# Patient Record
Sex: Male | Born: 1955 | Race: White | Hispanic: No | Marital: Married | Smoking: Current every day smoker
Health system: Southern US, Community
[De-identification: ages and names within clinical notes are randomized; demographics above are authoritative.]

## PROBLEM LIST (undated history)

## (undated) DIAGNOSIS — M1990 Unspecified osteoarthritis, unspecified site: Secondary | ICD-10-CM

## (undated) DIAGNOSIS — I10 Essential (primary) hypertension: Secondary | ICD-10-CM

## (undated) DIAGNOSIS — J449 Chronic obstructive pulmonary disease, unspecified: Secondary | ICD-10-CM

## (undated) DIAGNOSIS — E785 Hyperlipidemia, unspecified: Secondary | ICD-10-CM

## (undated) HISTORY — DX: Chronic obstructive pulmonary disease, unspecified: J44.9

## (undated) HISTORY — DX: Unspecified osteoarthritis, unspecified site: M19.90

## (undated) HISTORY — DX: Hyperlipidemia, unspecified: E78.5

## (undated) HISTORY — DX: Essential (primary) hypertension: I10

---
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
## 1983-11-21 HISTORY — PX: OTHER SURGICAL HISTORY: SHX169

## 1988-11-20 HISTORY — PX: CHOLECYSTECTOMY: SHX55

## 2009-11-20 HISTORY — PX: HEMORROIDECTOMY: SUR656

## 2011-11-21 HISTORY — PX: REPLACEMENT TOTAL KNEE: SUR1224

## 2016-03-28 DIAGNOSIS — Z6831 Body mass index (BMI) 31.0-31.9, adult: Secondary | ICD-10-CM | POA: Diagnosis not present

## 2016-03-28 DIAGNOSIS — I10 Essential (primary) hypertension: Secondary | ICD-10-CM | POA: Diagnosis not present

## 2016-03-28 DIAGNOSIS — Z720 Tobacco use: Secondary | ICD-10-CM | POA: Diagnosis not present

## 2016-03-28 DIAGNOSIS — F419 Anxiety disorder, unspecified: Secondary | ICD-10-CM | POA: Diagnosis not present

## 2016-04-28 DIAGNOSIS — I10 Essential (primary) hypertension: Secondary | ICD-10-CM | POA: Diagnosis not present

## 2016-04-28 DIAGNOSIS — E669 Obesity, unspecified: Secondary | ICD-10-CM | POA: Diagnosis not present

## 2016-04-28 DIAGNOSIS — Z6832 Body mass index (BMI) 32.0-32.9, adult: Secondary | ICD-10-CM | POA: Diagnosis not present

## 2016-04-28 DIAGNOSIS — M179 Osteoarthritis of knee, unspecified: Secondary | ICD-10-CM | POA: Diagnosis not present

## 2016-04-28 DIAGNOSIS — J449 Chronic obstructive pulmonary disease, unspecified: Secondary | ICD-10-CM | POA: Diagnosis not present

## 2016-04-28 DIAGNOSIS — F419 Anxiety disorder, unspecified: Secondary | ICD-10-CM | POA: Diagnosis not present

## 2016-07-27 DIAGNOSIS — J329 Chronic sinusitis, unspecified: Secondary | ICD-10-CM | POA: Diagnosis not present

## 2016-07-27 DIAGNOSIS — Z6832 Body mass index (BMI) 32.0-32.9, adult: Secondary | ICD-10-CM | POA: Diagnosis not present

## 2016-07-27 DIAGNOSIS — E669 Obesity, unspecified: Secondary | ICD-10-CM | POA: Diagnosis not present

## 2016-07-27 DIAGNOSIS — Z1389 Encounter for screening for other disorder: Secondary | ICD-10-CM | POA: Diagnosis not present

## 2016-07-27 DIAGNOSIS — F418 Other specified anxiety disorders: Secondary | ICD-10-CM | POA: Diagnosis not present

## 2016-08-29 DIAGNOSIS — E669 Obesity, unspecified: Secondary | ICD-10-CM | POA: Diagnosis not present

## 2016-08-29 DIAGNOSIS — I10 Essential (primary) hypertension: Secondary | ICD-10-CM | POA: Diagnosis not present

## 2016-08-29 DIAGNOSIS — F418 Other specified anxiety disorders: Secondary | ICD-10-CM | POA: Diagnosis not present

## 2016-08-29 DIAGNOSIS — R61 Generalized hyperhidrosis: Secondary | ICD-10-CM | POA: Diagnosis not present

## 2016-08-29 DIAGNOSIS — Z6832 Body mass index (BMI) 32.0-32.9, adult: Secondary | ICD-10-CM | POA: Diagnosis not present

## 2016-09-29 DIAGNOSIS — Z6832 Body mass index (BMI) 32.0-32.9, adult: Secondary | ICD-10-CM | POA: Diagnosis not present

## 2016-09-29 DIAGNOSIS — E669 Obesity, unspecified: Secondary | ICD-10-CM | POA: Diagnosis not present

## 2016-09-29 DIAGNOSIS — I10 Essential (primary) hypertension: Secondary | ICD-10-CM | POA: Diagnosis not present

## 2016-09-29 DIAGNOSIS — M549 Dorsalgia, unspecified: Secondary | ICD-10-CM | POA: Diagnosis not present

## 2016-09-29 DIAGNOSIS — F418 Other specified anxiety disorders: Secondary | ICD-10-CM | POA: Diagnosis not present

## 2016-10-06 DIAGNOSIS — M549 Dorsalgia, unspecified: Secondary | ICD-10-CM | POA: Diagnosis not present

## 2016-10-06 DIAGNOSIS — M5416 Radiculopathy, lumbar region: Secondary | ICD-10-CM | POA: Diagnosis not present

## 2016-10-06 DIAGNOSIS — M4316 Spondylolisthesis, lumbar region: Secondary | ICD-10-CM | POA: Diagnosis not present

## 2016-12-28 DIAGNOSIS — Z6833 Body mass index (BMI) 33.0-33.9, adult: Secondary | ICD-10-CM | POA: Diagnosis not present

## 2016-12-28 DIAGNOSIS — E669 Obesity, unspecified: Secondary | ICD-10-CM | POA: Diagnosis not present

## 2016-12-28 DIAGNOSIS — I10 Essential (primary) hypertension: Secondary | ICD-10-CM | POA: Diagnosis not present

## 2017-04-27 DIAGNOSIS — F419 Anxiety disorder, unspecified: Secondary | ICD-10-CM | POA: Diagnosis not present

## 2017-04-27 DIAGNOSIS — M179 Osteoarthritis of knee, unspecified: Secondary | ICD-10-CM | POA: Diagnosis not present

## 2017-04-27 DIAGNOSIS — I10 Essential (primary) hypertension: Secondary | ICD-10-CM | POA: Diagnosis not present

## 2017-04-27 DIAGNOSIS — Z7689 Persons encountering health services in other specified circumstances: Secondary | ICD-10-CM | POA: Diagnosis not present

## 2017-04-27 DIAGNOSIS — Z6831 Body mass index (BMI) 31.0-31.9, adult: Secondary | ICD-10-CM | POA: Diagnosis not present

## 2017-08-07 DIAGNOSIS — Z1389 Encounter for screening for other disorder: Secondary | ICD-10-CM | POA: Diagnosis not present

## 2017-08-07 DIAGNOSIS — Z0000 Encounter for general adult medical examination without abnormal findings: Secondary | ICD-10-CM | POA: Diagnosis not present

## 2017-08-07 DIAGNOSIS — Z9181 History of falling: Secondary | ICD-10-CM | POA: Diagnosis not present

## 2017-08-07 DIAGNOSIS — Z1211 Encounter for screening for malignant neoplasm of colon: Secondary | ICD-10-CM | POA: Diagnosis not present

## 2017-08-07 DIAGNOSIS — E669 Obesity, unspecified: Secondary | ICD-10-CM | POA: Diagnosis not present

## 2017-08-07 DIAGNOSIS — Z6832 Body mass index (BMI) 32.0-32.9, adult: Secondary | ICD-10-CM | POA: Diagnosis not present

## 2017-08-07 DIAGNOSIS — Z125 Encounter for screening for malignant neoplasm of prostate: Secondary | ICD-10-CM | POA: Diagnosis not present

## 2017-08-07 DIAGNOSIS — E785 Hyperlipidemia, unspecified: Secondary | ICD-10-CM | POA: Diagnosis not present

## 2017-08-07 DIAGNOSIS — Z136 Encounter for screening for cardiovascular disorders: Secondary | ICD-10-CM | POA: Diagnosis not present

## 2017-08-17 DIAGNOSIS — Z125 Encounter for screening for malignant neoplasm of prostate: Secondary | ICD-10-CM | POA: Diagnosis not present

## 2017-08-17 DIAGNOSIS — E669 Obesity, unspecified: Secondary | ICD-10-CM | POA: Diagnosis not present

## 2017-08-17 DIAGNOSIS — R7301 Impaired fasting glucose: Secondary | ICD-10-CM | POA: Diagnosis not present

## 2017-08-17 DIAGNOSIS — E785 Hyperlipidemia, unspecified: Secondary | ICD-10-CM | POA: Diagnosis not present

## 2017-08-17 DIAGNOSIS — Z1159 Encounter for screening for other viral diseases: Secondary | ICD-10-CM | POA: Diagnosis not present

## 2017-08-17 DIAGNOSIS — R748 Abnormal levels of other serum enzymes: Secondary | ICD-10-CM | POA: Diagnosis not present

## 2017-08-17 DIAGNOSIS — Z6832 Body mass index (BMI) 32.0-32.9, adult: Secondary | ICD-10-CM | POA: Diagnosis not present

## 2017-08-17 DIAGNOSIS — I10 Essential (primary) hypertension: Secondary | ICD-10-CM | POA: Diagnosis not present

## 2017-08-19 DIAGNOSIS — Z1212 Encounter for screening for malignant neoplasm of rectum: Secondary | ICD-10-CM | POA: Diagnosis not present

## 2017-08-19 DIAGNOSIS — Z1211 Encounter for screening for malignant neoplasm of colon: Secondary | ICD-10-CM | POA: Diagnosis not present

## 2017-09-18 DIAGNOSIS — I10 Essential (primary) hypertension: Secondary | ICD-10-CM | POA: Diagnosis not present

## 2017-09-18 DIAGNOSIS — Z6832 Body mass index (BMI) 32.0-32.9, adult: Secondary | ICD-10-CM | POA: Diagnosis not present

## 2017-10-19 DIAGNOSIS — I10 Essential (primary) hypertension: Secondary | ICD-10-CM | POA: Diagnosis not present

## 2017-10-19 DIAGNOSIS — E785 Hyperlipidemia, unspecified: Secondary | ICD-10-CM | POA: Diagnosis not present

## 2017-10-19 DIAGNOSIS — Z6832 Body mass index (BMI) 32.0-32.9, adult: Secondary | ICD-10-CM | POA: Diagnosis not present

## 2018-01-17 DIAGNOSIS — F411 Generalized anxiety disorder: Secondary | ICD-10-CM | POA: Diagnosis not present

## 2018-01-17 DIAGNOSIS — E785 Hyperlipidemia, unspecified: Secondary | ICD-10-CM | POA: Diagnosis not present

## 2018-01-17 DIAGNOSIS — Z6833 Body mass index (BMI) 33.0-33.9, adult: Secondary | ICD-10-CM | POA: Diagnosis not present

## 2018-01-17 DIAGNOSIS — I10 Essential (primary) hypertension: Secondary | ICD-10-CM | POA: Diagnosis not present

## 2018-02-15 DIAGNOSIS — Z6833 Body mass index (BMI) 33.0-33.9, adult: Secondary | ICD-10-CM | POA: Diagnosis not present

## 2018-02-15 DIAGNOSIS — F411 Generalized anxiety disorder: Secondary | ICD-10-CM | POA: Diagnosis not present

## 2018-02-15 DIAGNOSIS — I10 Essential (primary) hypertension: Secondary | ICD-10-CM | POA: Diagnosis not present

## 2018-04-17 DIAGNOSIS — F411 Generalized anxiety disorder: Secondary | ICD-10-CM | POA: Diagnosis not present

## 2018-04-17 DIAGNOSIS — J449 Chronic obstructive pulmonary disease, unspecified: Secondary | ICD-10-CM | POA: Diagnosis not present

## 2018-04-17 DIAGNOSIS — Z6833 Body mass index (BMI) 33.0-33.9, adult: Secondary | ICD-10-CM | POA: Diagnosis not present

## 2018-07-18 DIAGNOSIS — R000 Tachycardia, unspecified: Secondary | ICD-10-CM | POA: Diagnosis not present

## 2018-07-18 DIAGNOSIS — R7989 Other specified abnormal findings of blood chemistry: Secondary | ICD-10-CM | POA: Diagnosis not present

## 2018-07-18 DIAGNOSIS — R748 Abnormal levels of other serum enzymes: Secondary | ICD-10-CM | POA: Diagnosis not present

## 2018-07-18 DIAGNOSIS — I4892 Unspecified atrial flutter: Secondary | ICD-10-CM | POA: Diagnosis not present

## 2018-07-18 DIAGNOSIS — I10 Essential (primary) hypertension: Secondary | ICD-10-CM | POA: Diagnosis not present

## 2018-07-18 DIAGNOSIS — F419 Anxiety disorder, unspecified: Secondary | ICD-10-CM | POA: Diagnosis not present

## 2018-07-18 DIAGNOSIS — Z87891 Personal history of nicotine dependence: Secondary | ICD-10-CM | POA: Diagnosis not present

## 2018-07-18 DIAGNOSIS — Z6833 Body mass index (BMI) 33.0-33.9, adult: Secondary | ICD-10-CM | POA: Diagnosis not present

## 2018-07-18 DIAGNOSIS — J449 Chronic obstructive pulmonary disease, unspecified: Secondary | ICD-10-CM | POA: Diagnosis not present

## 2018-07-18 DIAGNOSIS — I483 Typical atrial flutter: Secondary | ICD-10-CM

## 2018-07-18 DIAGNOSIS — Z79899 Other long term (current) drug therapy: Secondary | ICD-10-CM | POA: Diagnosis not present

## 2018-07-18 DIAGNOSIS — J9811 Atelectasis: Secondary | ICD-10-CM | POA: Diagnosis not present

## 2018-07-18 DIAGNOSIS — E785 Hyperlipidemia, unspecified: Secondary | ICD-10-CM | POA: Diagnosis not present

## 2018-07-18 DIAGNOSIS — I4510 Unspecified right bundle-branch block: Secondary | ICD-10-CM | POA: Diagnosis not present

## 2018-07-18 DIAGNOSIS — K219 Gastro-esophageal reflux disease without esophagitis: Secondary | ICD-10-CM

## 2018-07-18 DIAGNOSIS — R457 State of emotional shock and stress, unspecified: Secondary | ICD-10-CM | POA: Diagnosis not present

## 2018-07-18 DIAGNOSIS — F411 Generalized anxiety disorder: Secondary | ICD-10-CM | POA: Diagnosis not present

## 2018-07-19 DIAGNOSIS — I483 Typical atrial flutter: Secondary | ICD-10-CM | POA: Diagnosis not present

## 2018-07-24 DIAGNOSIS — F411 Generalized anxiety disorder: Secondary | ICD-10-CM | POA: Diagnosis not present

## 2018-07-24 DIAGNOSIS — Z6833 Body mass index (BMI) 33.0-33.9, adult: Secondary | ICD-10-CM | POA: Diagnosis not present

## 2018-07-24 DIAGNOSIS — E785 Hyperlipidemia, unspecified: Secondary | ICD-10-CM | POA: Diagnosis not present

## 2018-07-24 DIAGNOSIS — I10 Essential (primary) hypertension: Secondary | ICD-10-CM | POA: Diagnosis not present

## 2018-07-24 DIAGNOSIS — I4892 Unspecified atrial flutter: Secondary | ICD-10-CM | POA: Diagnosis not present

## 2018-08-13 ENCOUNTER — Ambulatory Visit: Payer: Medicare HMO | Admitting: Cardiology

## 2018-08-15 DIAGNOSIS — Z136 Encounter for screening for cardiovascular disorders: Secondary | ICD-10-CM | POA: Diagnosis not present

## 2018-08-15 DIAGNOSIS — E785 Hyperlipidemia, unspecified: Secondary | ICD-10-CM | POA: Diagnosis not present

## 2018-08-15 DIAGNOSIS — Z125 Encounter for screening for malignant neoplasm of prostate: Secondary | ICD-10-CM | POA: Diagnosis not present

## 2018-08-15 DIAGNOSIS — Z6834 Body mass index (BMI) 34.0-34.9, adult: Secondary | ICD-10-CM | POA: Diagnosis not present

## 2018-08-15 DIAGNOSIS — Z0000 Encounter for general adult medical examination without abnormal findings: Secondary | ICD-10-CM | POA: Diagnosis not present

## 2018-08-15 DIAGNOSIS — Z1331 Encounter for screening for depression: Secondary | ICD-10-CM | POA: Diagnosis not present

## 2018-08-15 DIAGNOSIS — E669 Obesity, unspecified: Secondary | ICD-10-CM | POA: Diagnosis not present

## 2018-08-15 DIAGNOSIS — Z9181 History of falling: Secondary | ICD-10-CM | POA: Diagnosis not present

## 2018-08-26 DIAGNOSIS — Z6834 Body mass index (BMI) 34.0-34.9, adult: Secondary | ICD-10-CM | POA: Diagnosis not present

## 2018-08-26 DIAGNOSIS — B353 Tinea pedis: Secondary | ICD-10-CM | POA: Diagnosis not present

## 2018-09-10 DIAGNOSIS — B353 Tinea pedis: Secondary | ICD-10-CM | POA: Diagnosis not present

## 2018-09-10 DIAGNOSIS — B351 Tinea unguium: Secondary | ICD-10-CM | POA: Diagnosis not present

## 2019-01-29 DIAGNOSIS — E785 Hyperlipidemia, unspecified: Secondary | ICD-10-CM | POA: Diagnosis not present

## 2019-01-29 DIAGNOSIS — Z23 Encounter for immunization: Secondary | ICD-10-CM | POA: Diagnosis not present

## 2019-01-29 DIAGNOSIS — Z79899 Other long term (current) drug therapy: Secondary | ICD-10-CM | POA: Diagnosis not present

## 2019-01-29 DIAGNOSIS — I10 Essential (primary) hypertension: Secondary | ICD-10-CM | POA: Diagnosis not present

## 2019-01-29 DIAGNOSIS — F411 Generalized anxiety disorder: Secondary | ICD-10-CM | POA: Diagnosis not present

## 2019-01-29 DIAGNOSIS — K219 Gastro-esophageal reflux disease without esophagitis: Secondary | ICD-10-CM | POA: Diagnosis not present

## 2019-01-29 DIAGNOSIS — Z125 Encounter for screening for malignant neoplasm of prostate: Secondary | ICD-10-CM | POA: Diagnosis not present

## 2019-05-06 DIAGNOSIS — I10 Essential (primary) hypertension: Secondary | ICD-10-CM | POA: Diagnosis not present

## 2019-05-06 DIAGNOSIS — Z8679 Personal history of other diseases of the circulatory system: Secondary | ICD-10-CM | POA: Diagnosis not present

## 2019-05-06 DIAGNOSIS — J449 Chronic obstructive pulmonary disease, unspecified: Secondary | ICD-10-CM | POA: Diagnosis not present

## 2019-05-06 DIAGNOSIS — E785 Hyperlipidemia, unspecified: Secondary | ICD-10-CM | POA: Diagnosis not present

## 2019-05-06 DIAGNOSIS — F411 Generalized anxiety disorder: Secondary | ICD-10-CM | POA: Diagnosis not present

## 2019-05-06 DIAGNOSIS — Z9119 Patient's noncompliance with other medical treatment and regimen: Secondary | ICD-10-CM | POA: Diagnosis not present

## 2019-06-12 DIAGNOSIS — Z87891 Personal history of nicotine dependence: Secondary | ICD-10-CM | POA: Diagnosis not present

## 2019-06-12 DIAGNOSIS — F419 Anxiety disorder, unspecified: Secondary | ICD-10-CM | POA: Diagnosis not present

## 2019-06-12 DIAGNOSIS — Z6835 Body mass index (BMI) 35.0-35.9, adult: Secondary | ICD-10-CM | POA: Diagnosis not present

## 2019-06-12 DIAGNOSIS — J431 Panlobular emphysema: Secondary | ICD-10-CM | POA: Diagnosis not present

## 2019-06-12 DIAGNOSIS — I10 Essential (primary) hypertension: Secondary | ICD-10-CM | POA: Diagnosis not present

## 2019-10-11 ENCOUNTER — Inpatient Hospital Stay (HOSPITAL_COMMUNITY)
Admission: EM | Admit: 2019-10-11 | Discharge: 2019-10-13 | DRG: 493 | Disposition: A | Discharge status: Home Health | Payer: Medicare HMO | Attending: Orthopedic Surgery | Admitting: Orthopedic Surgery

## 2019-10-11 ENCOUNTER — Emergency Department (HOSPITAL_COMMUNITY): Payer: Medicare HMO

## 2019-10-11 ENCOUNTER — Encounter (HOSPITAL_COMMUNITY): Payer: Self-pay

## 2019-10-11 ENCOUNTER — Emergency Department (HOSPITAL_COMMUNITY): Payer: Medicare HMO | Admitting: Anesthesiology

## 2019-10-11 ENCOUNTER — Other Ambulatory Visit: Payer: Self-pay

## 2019-10-11 ENCOUNTER — Encounter (HOSPITAL_COMMUNITY)
Admission: EM | Disposition: A | Discharge status: Home Health | Payer: Self-pay | Source: Home / Self Care | Attending: Orthopedic Surgery

## 2019-10-11 DIAGNOSIS — S82891B Other fracture of right lower leg, initial encounter for open fracture type I or II: Secondary | ICD-10-CM | POA: Diagnosis present

## 2019-10-11 DIAGNOSIS — J449 Chronic obstructive pulmonary disease, unspecified: Secondary | ICD-10-CM | POA: Diagnosis present

## 2019-10-11 DIAGNOSIS — F17210 Nicotine dependence, cigarettes, uncomplicated: Secondary | ICD-10-CM | POA: Diagnosis present

## 2019-10-11 DIAGNOSIS — Z7951 Long term (current) use of inhaled steroids: Secondary | ICD-10-CM

## 2019-10-11 DIAGNOSIS — T1490XA Injury, unspecified, initial encounter: Secondary | ICD-10-CM | POA: Diagnosis present

## 2019-10-11 DIAGNOSIS — Z23 Encounter for immunization: Secondary | ICD-10-CM

## 2019-10-11 DIAGNOSIS — S8251XA Displaced fracture of medial malleolus of right tibia, initial encounter for closed fracture: Principal | ICD-10-CM | POA: Diagnosis present

## 2019-10-11 DIAGNOSIS — Z20828 Contact with and (suspected) exposure to other viral communicable diseases: Secondary | ICD-10-CM | POA: Diagnosis present

## 2019-10-11 DIAGNOSIS — E785 Hyperlipidemia, unspecified: Secondary | ICD-10-CM | POA: Diagnosis present

## 2019-10-11 DIAGNOSIS — S92011B Displaced fracture of body of right calcaneus, initial encounter for open fracture: Secondary | ICD-10-CM | POA: Diagnosis not present

## 2019-10-11 DIAGNOSIS — I10 Essential (primary) hypertension: Secondary | ICD-10-CM | POA: Diagnosis present

## 2019-10-11 DIAGNOSIS — S299XXA Unspecified injury of thorax, initial encounter: Secondary | ICD-10-CM | POA: Diagnosis not present

## 2019-10-11 DIAGNOSIS — S8254XA Nondisplaced fracture of medial malleolus of right tibia, initial encounter for closed fracture: Secondary | ICD-10-CM | POA: Diagnosis not present

## 2019-10-11 DIAGNOSIS — I959 Hypotension, unspecified: Secondary | ICD-10-CM | POA: Diagnosis not present

## 2019-10-11 DIAGNOSIS — M1990 Unspecified osteoarthritis, unspecified site: Secondary | ICD-10-CM | POA: Diagnosis present

## 2019-10-11 DIAGNOSIS — S92051A Displaced other extraarticular fracture of right calcaneus, initial encounter for closed fracture: Secondary | ICD-10-CM | POA: Diagnosis not present

## 2019-10-11 DIAGNOSIS — Z885 Allergy status to narcotic agent status: Secondary | ICD-10-CM | POA: Diagnosis not present

## 2019-10-11 DIAGNOSIS — F10929 Alcohol use, unspecified with intoxication, unspecified: Secondary | ICD-10-CM | POA: Diagnosis not present

## 2019-10-11 DIAGNOSIS — S92031B Displaced avulsion fracture of tuberosity of right calcaneus, initial encounter for open fracture: Secondary | ICD-10-CM | POA: Diagnosis not present

## 2019-10-11 DIAGNOSIS — Z03818 Encounter for observation for suspected exposure to other biological agents ruled out: Secondary | ICD-10-CM | POA: Diagnosis not present

## 2019-10-11 DIAGNOSIS — S82891C Other fracture of right lower leg, initial encounter for open fracture type IIIA, IIIB, or IIIC: Secondary | ICD-10-CM

## 2019-10-11 DIAGNOSIS — S8252XA Displaced fracture of medial malleolus of left tibia, initial encounter for closed fracture: Secondary | ICD-10-CM | POA: Diagnosis not present

## 2019-10-11 DIAGNOSIS — S92001B Unspecified fracture of right calcaneus, initial encounter for open fracture: Secondary | ICD-10-CM | POA: Diagnosis present

## 2019-10-11 DIAGNOSIS — Z79899 Other long term (current) drug therapy: Secondary | ICD-10-CM | POA: Diagnosis not present

## 2019-10-11 DIAGNOSIS — S82899A Other fracture of unspecified lower leg, initial encounter for closed fracture: Secondary | ICD-10-CM

## 2019-10-11 DIAGNOSIS — S2241XA Multiple fractures of ribs, right side, initial encounter for closed fracture: Secondary | ICD-10-CM | POA: Diagnosis not present

## 2019-10-11 DIAGNOSIS — R52 Pain, unspecified: Secondary | ICD-10-CM | POA: Diagnosis not present

## 2019-10-11 DIAGNOSIS — S8254XB Nondisplaced fracture of medial malleolus of right tibia, initial encounter for open fracture type I or II: Secondary | ICD-10-CM | POA: Diagnosis not present

## 2019-10-11 DIAGNOSIS — I4510 Unspecified right bundle-branch block: Secondary | ICD-10-CM | POA: Diagnosis not present

## 2019-10-11 DIAGNOSIS — S8254XC Nondisplaced fracture of medial malleolus of right tibia, initial encounter for open fracture type IIIA, IIIB, or IIIC: Secondary | ICD-10-CM | POA: Diagnosis not present

## 2019-10-11 DIAGNOSIS — R42 Dizziness and giddiness: Secondary | ICD-10-CM | POA: Diagnosis not present

## 2019-10-11 DIAGNOSIS — Y92410 Unspecified street and highway as the place of occurrence of the external cause: Secondary | ICD-10-CM

## 2019-10-11 DIAGNOSIS — S92011A Displaced fracture of body of right calcaneus, initial encounter for closed fracture: Secondary | ICD-10-CM | POA: Diagnosis not present

## 2019-10-11 DIAGNOSIS — S92041B Displaced other fracture of tuberosity of right calcaneus, initial encounter for open fracture: Secondary | ICD-10-CM | POA: Diagnosis not present

## 2019-10-11 DIAGNOSIS — S0990XA Unspecified injury of head, initial encounter: Secondary | ICD-10-CM | POA: Diagnosis not present

## 2019-10-11 DIAGNOSIS — S3991XA Unspecified injury of abdomen, initial encounter: Secondary | ICD-10-CM | POA: Diagnosis not present

## 2019-10-11 DIAGNOSIS — S199XXA Unspecified injury of neck, initial encounter: Secondary | ICD-10-CM | POA: Diagnosis not present

## 2019-10-11 HISTORY — PX: I&D EXTREMITY: SHX5045

## 2019-10-11 HISTORY — PX: ORIF CALCANEOUS FRACTURE: SHX5030

## 2019-10-11 LAB — CBC
HCT: 43.5 % (ref 39.0–52.0)
Hemoglobin: 15 g/dL (ref 13.0–17.0)
MCH: 31.9 pg (ref 26.0–34.0)
MCHC: 34.5 g/dL (ref 30.0–36.0)
MCV: 92.6 fL (ref 80.0–100.0)
Platelets: 354 10*3/uL (ref 150–400)
RBC: 4.7 MIL/uL (ref 4.22–5.81)
RDW: 13 % (ref 11.5–15.5)
WBC: 13.4 10*3/uL — ABNORMAL HIGH (ref 4.0–10.5)
nRBC: 0 % (ref 0.0–0.2)

## 2019-10-11 LAB — COMPREHENSIVE METABOLIC PANEL
ALT: 25 U/L (ref 0–44)
AST: 33 U/L (ref 15–41)
Albumin: 3.6 g/dL (ref 3.5–5.0)
Alkaline Phosphatase: 50 U/L (ref 38–126)
Anion gap: 13 (ref 5–15)
BUN: 12 mg/dL (ref 8–23)
CO2: 21 mmol/L — ABNORMAL LOW (ref 22–32)
Calcium: 9.1 mg/dL (ref 8.9–10.3)
Chloride: 98 mmol/L (ref 98–111)
Creatinine, Ser: 1.14 mg/dL (ref 0.61–1.24)
GFR calc Af Amer: >60 mL/min (ref >60)
GFR calc non Af Amer: >60 mL/min (ref >60)
Glucose, Bld: 94 mg/dL (ref 70–99)
Potassium: 3.7 mmol/L (ref 3.5–5.1)
Sodium: 132 mmol/L — ABNORMAL LOW (ref 135–145)
Total Bilirubin: 0.6 mg/dL (ref 0.3–1.2)
Total Protein: 6.9 g/dL (ref 6.5–8.1)

## 2019-10-11 LAB — SARS CORONAVIRUS 2 BY RT PCR (HOSPITAL ORDER, PERFORMED IN ~~LOC~~ HOSPITAL LAB): SARS Coronavirus 2: NEGATIVE

## 2019-10-11 LAB — SAMPLE TO BLOOD BANK

## 2019-10-11 LAB — CDS SEROLOGY

## 2019-10-11 LAB — ETHANOL: Alcohol, Ethyl (B): 117 mg/dL — ABNORMAL HIGH (ref <10)

## 2019-10-11 LAB — PROTIME-INR
INR: 1.1 (ref 0.8–1.2)
Prothrombin Time: 13.7 seconds (ref 11.4–15.2)

## 2019-10-11 SURGERY — IRRIGATION AND DEBRIDEMENT EXTREMITY
Anesthesia: General | Site: Ankle | Laterality: Right

## 2019-10-11 MED ORDER — SUCCINYLCHOLINE CHLORIDE 200 MG/10ML IV SOSY
PREFILLED_SYRINGE | INTRAVENOUS | Status: AC
  Filled 2019-10-11: qty 10

## 2019-10-11 MED ORDER — CEFAZOLIN SODIUM-DEXTROSE 2-4 GM/100ML-% IV SOLN
2.0000 g | Freq: Once | INTRAVENOUS | Status: AC
  Administered 2019-10-11: 2 g via INTRAVENOUS
  Filled 2019-10-11: qty 100

## 2019-10-11 MED ORDER — ONDANSETRON HCL 4 MG/2ML IJ SOLN
INTRAMUSCULAR | Status: AC
  Filled 2019-10-11: qty 2

## 2019-10-11 MED ORDER — ROCURONIUM BROMIDE 100 MG/10ML IV SOLN
INTRAVENOUS | Status: DC | PRN
  Administered 2019-10-11: 50 mg via INTRAVENOUS

## 2019-10-11 MED ORDER — GENTAMICIN SULFATE 40 MG/ML IJ SOLN
390.0000 mg | INTRAVENOUS | Status: DC
  Administered 2019-10-11 – 2019-10-12 (×2): 390 mg via INTRAVENOUS
  Filled 2019-10-11 (×3): qty 9.75

## 2019-10-11 MED ORDER — CEFAZOLIN SODIUM 1 G IJ SOLR
INTRAMUSCULAR | Status: AC
  Filled 2019-10-11: qty 20

## 2019-10-11 MED ORDER — ALBUMIN HUMAN 5 % IV SOLN
INTRAVENOUS | Status: DC | PRN
  Administered 2019-10-11 (×3): via INTRAVENOUS

## 2019-10-11 MED ORDER — SODIUM CHLORIDE 0.9 % IR SOLN
Status: DC | PRN
  Administered 2019-10-11: 1000 mL

## 2019-10-11 MED ORDER — EPHEDRINE SULFATE 50 MG/ML IJ SOLN
INTRAMUSCULAR | Status: DC | PRN
  Administered 2019-10-11: 5 mg via INTRAVENOUS
  Administered 2019-10-11: 10 mg via INTRAVENOUS

## 2019-10-11 MED ORDER — ROCURONIUM BROMIDE 10 MG/ML (PF) SYRINGE
PREFILLED_SYRINGE | INTRAVENOUS | Status: AC
  Filled 2019-10-11: qty 10

## 2019-10-11 MED ORDER — MIDAZOLAM HCL 2 MG/2ML IJ SOLN
INTRAMUSCULAR | Status: AC
  Filled 2019-10-11: qty 2

## 2019-10-11 MED ORDER — TETANUS-DIPHTH-ACELL PERTUSSIS 5-2.5-18.5 LF-MCG/0.5 IM SUSP
0.5000 mL | Freq: Once | INTRAMUSCULAR | Status: AC
  Administered 2019-10-11: 0.5 mL via INTRAMUSCULAR
  Filled 2019-10-11: qty 0.5

## 2019-10-11 MED ORDER — PHENYLEPHRINE 40 MCG/ML (10ML) SYRINGE FOR IV PUSH (FOR BLOOD PRESSURE SUPPORT)
PREFILLED_SYRINGE | INTRAVENOUS | Status: AC
  Filled 2019-10-11: qty 10

## 2019-10-11 MED ORDER — CEFAZOLIN SODIUM-DEXTROSE 2-4 GM/100ML-% IV SOLN
2.0000 g | Freq: Three times a day (TID) | INTRAVENOUS | Status: DC
  Administered 2019-10-11: 2 g via INTRAVENOUS

## 2019-10-11 MED ORDER — FENTANYL CITRATE (PF) 100 MCG/2ML IJ SOLN
50.0000 ug | Freq: Once | INTRAMUSCULAR | Status: AC | PRN
  Administered 2019-10-11: 50 ug via INTRAVENOUS
  Filled 2019-10-11: qty 2

## 2019-10-11 MED ORDER — LIDOCAINE 2% (20 MG/ML) 5 ML SYRINGE
INTRAMUSCULAR | Status: AC
  Filled 2019-10-11: qty 5

## 2019-10-11 MED ORDER — EPHEDRINE 5 MG/ML INJ
INTRAVENOUS | Status: AC
  Filled 2019-10-11: qty 10

## 2019-10-11 MED ORDER — PHENYLEPHRINE HCL (PRESSORS) 10 MG/ML IV SOLN
INTRAVENOUS | Status: DC | PRN
  Administered 2019-10-11 (×2): 40 ug via INTRAVENOUS
  Administered 2019-10-11: 80 ug via INTRAVENOUS

## 2019-10-11 MED ORDER — PROPOFOL 10 MG/ML IV BOLUS
INTRAVENOUS | Status: DC | PRN
  Administered 2019-10-11: 30 mg via INTRAVENOUS
  Administered 2019-10-11: 170 mg via INTRAVENOUS

## 2019-10-11 MED ORDER — LACTATED RINGERS IV SOLN
INTRAVENOUS | Status: DC | PRN
  Administered 2019-10-11 (×2): via INTRAVENOUS

## 2019-10-11 MED ORDER — IOHEXOL 300 MG/ML  SOLN
100.0000 mL | Freq: Once | INTRAMUSCULAR | Status: AC | PRN
  Administered 2019-10-11: 100 mL via INTRAVENOUS

## 2019-10-11 MED ORDER — PROPOFOL 10 MG/ML IV BOLUS
INTRAVENOUS | Status: AC
  Filled 2019-10-11: qty 40

## 2019-10-11 MED ORDER — MIDAZOLAM HCL 5 MG/5ML IJ SOLN
INTRAMUSCULAR | Status: DC | PRN
  Administered 2019-10-11 (×2): 1 mg via INTRAVENOUS

## 2019-10-11 MED ORDER — LIDOCAINE HCL (CARDIAC) PF 100 MG/5ML IV SOSY
PREFILLED_SYRINGE | INTRAVENOUS | Status: DC | PRN
  Administered 2019-10-11: 60 mg via INTRATRACHEAL

## 2019-10-11 MED ORDER — SUCCINYLCHOLINE CHLORIDE 20 MG/ML IJ SOLN
INTRAMUSCULAR | Status: DC | PRN
  Administered 2019-10-11: 120 mg via INTRAVENOUS

## 2019-10-11 MED ORDER — ONDANSETRON HCL 4 MG/2ML IJ SOLN
INTRAMUSCULAR | Status: DC | PRN
  Administered 2019-10-11: 4 mg via INTRAVENOUS

## 2019-10-11 MED ORDER — PHENYLEPHRINE HCL-NACL 10-0.9 MG/250ML-% IV SOLN
INTRAVENOUS | Status: DC | PRN
  Administered 2019-10-11: 40 ug/min via INTRAVENOUS

## 2019-10-11 MED ORDER — FENTANYL CITRATE (PF) 250 MCG/5ML IJ SOLN
INTRAMUSCULAR | Status: DC | PRN
  Administered 2019-10-11 (×5): 50 ug via INTRAVENOUS

## 2019-10-11 MED ORDER — ARTIFICIAL TEARS OPHTHALMIC OINT
TOPICAL_OINTMENT | OPHTHALMIC | Status: AC
  Filled 2019-10-11: qty 3.5

## 2019-10-11 MED ORDER — FENTANYL CITRATE (PF) 250 MCG/5ML IJ SOLN
INTRAMUSCULAR | Status: AC
  Filled 2019-10-11: qty 5

## 2019-10-11 SURGICAL SUPPLY — 64 items
ALCOHOL 70% 16 OZ (MISCELLANEOUS) ×4 IMPLANT
BANDAGE ESMARK 6X9 LF (GAUZE/BANDAGES/DRESSINGS) IMPLANT
BNDG CMPR 9X6 STRL LF SNTH (GAUZE/BANDAGES/DRESSINGS) ×2
BNDG CMPR MED 10X6 ELC LF (GAUZE/BANDAGES/DRESSINGS) ×2
BNDG COHESIVE 4X5 TAN STRL (GAUZE/BANDAGES/DRESSINGS) ×4 IMPLANT
BNDG ELASTIC 4X5.8 VLCR STR LF (GAUZE/BANDAGES/DRESSINGS) ×2 IMPLANT
BNDG ELASTIC 6X10 VLCR STRL LF (GAUZE/BANDAGES/DRESSINGS) ×2 IMPLANT
BNDG ELASTIC 6X5.8 VLCR STR LF (GAUZE/BANDAGES/DRESSINGS) IMPLANT
BNDG ESMARK 6X9 LF (GAUZE/BANDAGES/DRESSINGS) ×4
BNDG GAUZE ELAST 4 BULKY (GAUZE/BANDAGES/DRESSINGS) ×2 IMPLANT
CANISTER SUCT 3000ML PPV (MISCELLANEOUS) ×4 IMPLANT
COVER SURGICAL LIGHT HANDLE (MISCELLANEOUS) ×4 IMPLANT
COVER WAND RF STERILE (DRAPES) ×4 IMPLANT
CUFF TOURN SGL QUICK 34 (TOURNIQUET CUFF)
CUFF TRNQT CYL 34X4.125X (TOURNIQUET CUFF) IMPLANT
DRAPE C-ARM 42X72 X-RAY (DRAPES) ×4 IMPLANT
DRAPE C-ARMOR (DRAPES) ×4 IMPLANT
DRAPE U-SHAPE 47X51 STRL (DRAPES) ×4 IMPLANT
DRSG ADAPTIC 3X8 NADH LF (GAUZE/BANDAGES/DRESSINGS) ×4 IMPLANT
DRSG PAD ABDOMINAL 8X10 ST (GAUZE/BANDAGES/DRESSINGS) ×4 IMPLANT
DURAPREP 26ML APPLICATOR (WOUND CARE) ×2 IMPLANT
ELECT REM PT RETURN 9FT ADLT (ELECTROSURGICAL) ×4
ELECTRODE REM PT RTRN 9FT ADLT (ELECTROSURGICAL) ×2 IMPLANT
GAUZE SPONGE 4X4 12PLY STRL (GAUZE/BANDAGES/DRESSINGS) ×4 IMPLANT
GLOVE BIO SURGEON STRL SZ7.5 (GLOVE) ×4 IMPLANT
GLOVE BIOGEL PI IND STRL 8 (GLOVE) ×2 IMPLANT
GLOVE BIOGEL PI INDICATOR 8 (GLOVE) ×2
GOWN STRL REUS W/ TWL LRG LVL3 (GOWN DISPOSABLE) ×4 IMPLANT
GOWN STRL REUS W/ TWL XL LVL3 (GOWN DISPOSABLE) ×2 IMPLANT
GOWN STRL REUS W/TWL LRG LVL3 (GOWN DISPOSABLE) ×8
GOWN STRL REUS W/TWL XL LVL3 (GOWN DISPOSABLE) ×4
GUIDEWARE NON THREAD 1.25X150 (WIRE) ×20
GUIDEWIRE NON THREAD 1.25X150 (WIRE) IMPLANT
KIT BASIN OR (CUSTOM PROCEDURE TRAY) ×4 IMPLANT
KIT TURNOVER KIT B (KITS) ×4 IMPLANT
NDL HYPO 25GX1X1/2 BEV (NEEDLE) IMPLANT
NEEDLE HYPO 25GX1X1/2 BEV (NEEDLE) IMPLANT
NS IRRIG 1000ML POUR BTL (IV SOLUTION) ×4 IMPLANT
PACK ORTHO EXTREMITY (CUSTOM PROCEDURE TRAY) ×4 IMPLANT
PAD ABD 8X10 STRL (GAUZE/BANDAGES/DRESSINGS) ×2 IMPLANT
PAD ARMBOARD 7.5X6 YLW CONV (MISCELLANEOUS) ×8 IMPLANT
PAD CAST 4YDX4 CTTN HI CHSV (CAST SUPPLIES) ×4 IMPLANT
PADDING CAST COTTON 4X4 STRL (CAST SUPPLIES) ×8
PADDING CAST COTTON 6X4 STRL (CAST SUPPLIES) ×4 IMPLANT
SCREW CANN HDLS 3.5X36 (Screw) ×2 IMPLANT
SCREW CANN HDLS 3.5X40 (Screw) ×2 IMPLANT
SCREW CANN HDLS 4.0X60 (Screw) ×2 IMPLANT
SCREW CANN HDLS 4.5X60 (Screw) ×2 IMPLANT
SCREW CANN HDLS SHRT 4.5X70 (Screw) ×2 IMPLANT
SPLINT FIBERGLASS 4X30 (CAST SUPPLIES) ×2 IMPLANT
SPONGE LAP 18X18 RF (DISPOSABLE) IMPLANT
SUCTION FRAZIER HANDLE 10FR (MISCELLANEOUS) ×2
SUCTION TUBE FRAZIER 10FR DISP (MISCELLANEOUS) ×2 IMPLANT
SUT ETHILON 2 0 FS 18 (SUTURE) ×4 IMPLANT
SUT ETHILON 3 0 PS 1 (SUTURE) ×2 IMPLANT
SUT VIC AB 0 CT1 27 (SUTURE)
SUT VIC AB 0 CT1 27XBRD ANBCTR (SUTURE) IMPLANT
SUT VIC AB 2-0 CT1 27 (SUTURE)
SUT VIC AB 2-0 CT1 TAPERPNT 27 (SUTURE) ×2 IMPLANT
SYR CONTROL 10ML LL (SYRINGE) IMPLANT
TOWEL GREEN STERILE (TOWEL DISPOSABLE) ×8 IMPLANT
TUBE CONNECTING 12'X1/4 (SUCTIONS) ×1
TUBE CONNECTING 12X1/4 (SUCTIONS) ×3 IMPLANT
YANKAUER SUCT BULB TIP NO VENT (SUCTIONS) ×4 IMPLANT
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
## 2019-10-11 NOTE — ED Notes (Signed)
Pt to CT via stretcher

## 2019-10-11 NOTE — ED Notes (Signed)
Pt. Complains of new onset of pain to right lateral side. Pain is only present on moving or sneezin/cough. Pt. States it as a flash burn at a 10 then it subsides

## 2019-10-11 NOTE — Anesthesia Preprocedure Evaluation (Addendum)
Anesthesia Evaluation  Patient identified by MRN, date of birth, ID band Patient awake    Reviewed: Allergy & Precautions, NPO status , Patient's Chart, lab work & pertinent test results  Airway Mallampati: II  TM Distance: >3 FB Neck ROM: Full    Dental  (+) Teeth Intact, Dental Advisory Given   Pulmonary Current Smoker,    breath sounds clear to auscultation       Cardiovascular  Rhythm:Regular Rate:Normal     Neuro/Psych    GI/Hepatic   Endo/Other    Renal/GU      Musculoskeletal   Abdominal (+) + obese,   Peds  Hematology   Anesthesia Other Findings   Reproductive/Obstetrics                            Anesthesia Physical Anesthesia Plan  ASA: III and emergent  Anesthesia Plan: General   Post-op Pain Management:    Induction: Intravenous, Rapid sequence and Cricoid pressure planned  PONV Risk Score and Plan: Ondansetron and Dexamethasone  Airway Management Planned: Oral ETT  Additional Equipment:   Intra-op Plan:   Post-operative Plan: Extubation in OR  Informed Consent: I have reviewed the patients History and Physical, chart, labs and discussed the procedure including the risks, benefits and alternatives for the proposed anesthesia with the patient or authorized representative who has indicated his/her understanding and acceptance.     Dental advisory given  Plan Discussed with: CRNA and Anesthesiologist  Anesthesia Plan Comments:         Anesthesia Quick Evaluation
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
## 2019-10-11 NOTE — Anesthesia Procedure Notes (Signed)
Procedure Name: Intubation Date/Time: 10/11/2019 10:50 PM Performed by: Clovis Cao, CRNA Pre-anesthesia Checklist: Patient identified, Emergency Drugs available, Suction available, Patient being monitored and Timeout performed Patient Re-evaluated:Patient Re-evaluated prior to induction Oxygen Delivery Method: Circle system utilized Preoxygenation: Pre-oxygenation with 100% oxygen Induction Type: IV induction, Rapid sequence and Cricoid Pressure applied Laryngoscope Size: Miller and 2 Grade View: Grade I Tube type: Oral Tube size: 7.5 mm Number of attempts: 1 Airway Equipment and Method: Stylet Placement Confirmation: ETT inserted through vocal cords under direct vision,  positive ETCO2 and breath sounds checked- equal and bilateral Secured at: 22 cm Tube secured with: Tape Dental Injury: Teeth and Oropharynx as per pre-operative assessment

## 2019-10-11 NOTE — ED Notes (Signed)
Family updated as to patient's status.
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
## 2019-10-12 ENCOUNTER — Other Ambulatory Visit: Payer: Self-pay

## 2019-10-12 DIAGNOSIS — S82891B Other fracture of right lower leg, initial encounter for open fracture type I or II: Secondary | ICD-10-CM | POA: Diagnosis present

## 2019-10-12 DIAGNOSIS — Y92410 Unspecified street and highway as the place of occurrence of the external cause: Secondary | ICD-10-CM | POA: Diagnosis not present

## 2019-10-12 DIAGNOSIS — S92001B Unspecified fracture of right calcaneus, initial encounter for open fracture: Secondary | ICD-10-CM | POA: Diagnosis present

## 2019-10-12 DIAGNOSIS — I959 Hypotension, unspecified: Secondary | ICD-10-CM | POA: Diagnosis not present

## 2019-10-12 DIAGNOSIS — F17210 Nicotine dependence, cigarettes, uncomplicated: Secondary | ICD-10-CM | POA: Diagnosis present

## 2019-10-12 DIAGNOSIS — Z20828 Contact with and (suspected) exposure to other viral communicable diseases: Secondary | ICD-10-CM | POA: Diagnosis present

## 2019-10-12 DIAGNOSIS — Z23 Encounter for immunization: Secondary | ICD-10-CM | POA: Diagnosis present

## 2019-10-12 DIAGNOSIS — J449 Chronic obstructive pulmonary disease, unspecified: Secondary | ICD-10-CM | POA: Diagnosis present

## 2019-10-12 DIAGNOSIS — E785 Hyperlipidemia, unspecified: Secondary | ICD-10-CM | POA: Diagnosis present

## 2019-10-12 DIAGNOSIS — I10 Essential (primary) hypertension: Secondary | ICD-10-CM | POA: Diagnosis present

## 2019-10-12 DIAGNOSIS — M1990 Unspecified osteoarthritis, unspecified site: Secondary | ICD-10-CM | POA: Diagnosis present

## 2019-10-12 DIAGNOSIS — S8251XA Displaced fracture of medial malleolus of right tibia, initial encounter for closed fracture: Secondary | ICD-10-CM | POA: Diagnosis present

## 2019-10-12 DIAGNOSIS — Z7951 Long term (current) use of inhaled steroids: Secondary | ICD-10-CM | POA: Diagnosis not present

## 2019-10-12 DIAGNOSIS — Z79899 Other long term (current) drug therapy: Secondary | ICD-10-CM | POA: Diagnosis not present

## 2019-10-12 DIAGNOSIS — T1490XA Injury, unspecified, initial encounter: Secondary | ICD-10-CM | POA: Diagnosis present

## 2019-10-12 DIAGNOSIS — Z885 Allergy status to narcotic agent status: Secondary | ICD-10-CM | POA: Diagnosis not present

## 2019-10-12 DIAGNOSIS — S92031B Displaced avulsion fracture of tuberosity of right calcaneus, initial encounter for open fracture: Secondary | ICD-10-CM | POA: Diagnosis present

## 2019-10-12 DIAGNOSIS — R42 Dizziness and giddiness: Secondary | ICD-10-CM | POA: Diagnosis not present

## 2019-10-12 LAB — HIV ANTIBODY (ROUTINE TESTING W REFLEX): HIV Screen 4th Generation wRfx: NONREACTIVE

## 2019-10-12 LAB — GENTAMICIN LEVEL, RANDOM: Gentamicin Rm: 4.2 ug/mL

## 2019-10-12 MED ORDER — ACETAMINOPHEN 500 MG PO TABS
500.0000 mg | ORAL_TABLET | Freq: Four times a day (QID) | ORAL | Status: AC
  Administered 2019-10-12 – 2019-10-13 (×4): 500 mg via ORAL
  Filled 2019-10-12 (×4): qty 1

## 2019-10-12 MED ORDER — TRAMADOL HCL 50 MG PO TABS
50.0000 mg | ORAL_TABLET | Freq: Four times a day (QID) | ORAL | Status: DC
  Administered 2019-10-12 – 2019-10-13 (×6): 50 mg via ORAL
  Filled 2019-10-12 (×6): qty 1

## 2019-10-12 MED ORDER — DEXMEDETOMIDINE BOLUS VIA INFUSION
0.5000 ug/kg | Freq: Once | INTRAVENOUS | Status: DC
  Filled 2019-10-12: qty 40

## 2019-10-12 MED ORDER — ENOXAPARIN SODIUM 40 MG/0.4ML ~~LOC~~ SOLN
40.0000 mg | SUBCUTANEOUS | Status: DC
  Administered 2019-10-12: 40 mg via SUBCUTANEOUS
  Filled 2019-10-12 (×2): qty 0.4

## 2019-10-12 MED ORDER — HYDROCODONE-ACETAMINOPHEN 5-325 MG PO TABS
1.0000 | ORAL_TABLET | ORAL | Status: DC | PRN
  Administered 2019-10-12: 06:00:00 1 via ORAL
  Filled 2019-10-12: qty 1

## 2019-10-12 MED ORDER — MORPHINE SULFATE (PF) 2 MG/ML IV SOLN: 0.5000 mg | INTRAVENOUS | Status: DC | PRN

## 2019-10-12 MED ORDER — ENSURE PRE-SURGERY PO LIQD
296.0000 mL | Freq: Once | ORAL | Status: DC
  Filled 2019-10-12: qty 296

## 2019-10-12 MED ORDER — FENTANYL CITRATE (PF) 100 MCG/2ML IJ SOLN
25.0000 ug | INTRAMUSCULAR | Status: DC | PRN
  Administered 2019-10-12 (×3): 50 ug via INTRAVENOUS

## 2019-10-12 MED ORDER — CLONAZEPAM 1 MG PO TABS
1.0000 mg | ORAL_TABLET | Freq: Two times a day (BID) | ORAL | Status: DC
  Administered 2019-10-12 – 2019-10-13 (×3): 1 mg via ORAL
  Filled 2019-10-12 (×3): qty 1

## 2019-10-12 MED ORDER — VALSARTAN-HYDROCHLOROTHIAZIDE 320-12.5 MG PO TABS: 1.0000 | ORAL_TABLET | Freq: Every morning | ORAL | Status: DC

## 2019-10-12 MED ORDER — DEXMEDETOMIDINE HCL IN NACL 80 MCG/20ML IV SOLN
INTRAVENOUS | Status: AC
  Filled 2019-10-12: qty 20

## 2019-10-12 MED ORDER — POVIDONE-IODINE 10 % EX SWAB: 2.0000 "application " | Freq: Once | CUTANEOUS | Status: DC

## 2019-10-12 MED ORDER — MIDAZOLAM HCL 2 MG/2ML IJ SOLN
INTRAMUSCULAR | Status: AC
  Filled 2019-10-12: qty 2

## 2019-10-12 MED ORDER — HYDROCHLOROTHIAZIDE 12.5 MG PO CAPS
12.5000 mg | ORAL_CAPSULE | Freq: Every day | ORAL | Status: DC
  Administered 2019-10-12: 12.5 mg via ORAL
  Filled 2019-10-12 (×2): qty 1

## 2019-10-12 MED ORDER — FENTANYL CITRATE (PF) 100 MCG/2ML IJ SOLN
INTRAMUSCULAR | Status: AC
  Filled 2019-10-12: qty 2

## 2019-10-12 MED ORDER — ONDANSETRON HCL 4 MG PO TABS: 4.0000 mg | ORAL_TABLET | Freq: Four times a day (QID) | ORAL | Status: DC | PRN

## 2019-10-12 MED ORDER — MOMETASONE FURO-FORMOTEROL FUM 200-5 MCG/ACT IN AERO
2.0000 | INHALATION_SPRAY | Freq: Two times a day (BID) | RESPIRATORY_TRACT | Status: DC
  Filled 2019-10-12: qty 8.8

## 2019-10-12 MED ORDER — IRBESARTAN 300 MG PO TABS
300.0000 mg | ORAL_TABLET | Freq: Every day | ORAL | Status: DC
  Administered 2019-10-12: 09:00:00 300 mg via ORAL
  Filled 2019-10-12 (×2): qty 1

## 2019-10-12 MED ORDER — CEFAZOLIN SODIUM-DEXTROSE 2-4 GM/100ML-% IV SOLN
2.0000 g | Freq: Three times a day (TID) | INTRAVENOUS | Status: DC
  Administered 2019-10-12 – 2019-10-13 (×5): 2 g via INTRAVENOUS
  Filled 2019-10-12 (×6): qty 100

## 2019-10-12 MED ORDER — ACETAMINOPHEN 10 MG/ML IV SOLN
INTRAVENOUS | Status: AC
  Administered 2019-10-12: 1000 mg via INTRAVENOUS
  Filled 2019-10-12: qty 100

## 2019-10-12 MED ORDER — ACETAMINOPHEN 325 MG PO TABS
325.0000 mg | ORAL_TABLET | Freq: Four times a day (QID) | ORAL | Status: DC | PRN
  Filled 2019-10-12 (×2): qty 2

## 2019-10-12 MED ORDER — ONDANSETRON HCL 4 MG/2ML IJ SOLN: 4.0000 mg | Freq: Once | INTRAMUSCULAR | Status: DC | PRN

## 2019-10-12 MED ORDER — METHOCARBAMOL 500 MG PO TABS
500.0000 mg | ORAL_TABLET | Freq: Four times a day (QID) | ORAL | Status: DC | PRN
  Administered 2019-10-12 – 2019-10-13 (×4): 500 mg via ORAL
  Filled 2019-10-12 (×5): qty 1

## 2019-10-12 MED ORDER — SUGAMMADEX SODIUM 200 MG/2ML IV SOLN
INTRAVENOUS | Status: DC | PRN
  Administered 2019-10-12: 200 mg via INTRAVENOUS

## 2019-10-12 MED ORDER — MIDAZOLAM HCL 2 MG/2ML IJ SOLN
1.0000 mg | Freq: Once | INTRAMUSCULAR | Status: AC
  Administered 2019-10-12: 1 mg via INTRAVENOUS

## 2019-10-12 MED ORDER — ACETAMINOPHEN 10 MG/ML IV SOLN: 1000.0000 mg | Freq: Four times a day (QID) | INTRAVENOUS | Status: DC

## 2019-10-12 MED ORDER — PANTOPRAZOLE SODIUM 40 MG PO TBEC
40.0000 mg | DELAYED_RELEASE_TABLET | Freq: Every day | ORAL | Status: DC
  Administered 2019-10-12 – 2019-10-13 (×2): 40 mg via ORAL
  Filled 2019-10-12 (×2): qty 1

## 2019-10-12 MED ORDER — ONDANSETRON HCL 4 MG/2ML IJ SOLN: 4.0000 mg | Freq: Four times a day (QID) | INTRAMUSCULAR | Status: DC | PRN

## 2019-10-12 MED ORDER — CHLORHEXIDINE GLUCONATE 4 % EX LIQD: 60.0000 mL | Freq: Once | CUTANEOUS | Status: DC

## 2019-10-12 MED ORDER — METHOCARBAMOL 1000 MG/10ML IJ SOLN
500.0000 mg | Freq: Four times a day (QID) | INTRAVENOUS | Status: DC | PRN
  Administered 2019-10-12: 500 mg via INTRAVENOUS
  Filled 2019-10-12: qty 5

## 2019-10-12 MED ORDER — CEFAZOLIN SODIUM-DEXTROSE 2-4 GM/100ML-% IV SOLN: 2.0000 g | INTRAVENOUS | Status: DC

## 2019-10-12 MED ORDER — NAPROXEN 250 MG PO TABS
250.0000 mg | ORAL_TABLET | Freq: Two times a day (BID) | ORAL | Status: DC
  Administered 2019-10-12 – 2019-10-13 (×2): 250 mg via ORAL
  Filled 2019-10-12 (×5): qty 1

## 2019-10-12 MED ORDER — HYDROCODONE-ACETAMINOPHEN 7.5-325 MG PO TABS
1.0000 | ORAL_TABLET | ORAL | Status: DC | PRN
  Administered 2019-10-12 – 2019-10-13 (×3): 1 via ORAL
  Filled 2019-10-12 (×3): qty 1

## 2019-10-12 NOTE — Brief Op Note (Signed)
10/11/2019 - 10/12/2019  12:19 AM  PATIENT:  Governor Rooks Fleites  63 y.o. male  PRE-OPERATIVE DIAGNOSIS:  1.  Type 3 OPEN RIGHT CALCANEOUS FRACTURE 2.  closed right medial malleolus fracture  POST-OPERATIVE DIAGNOSIS:   same  PROCEDURE:  Procedure(s): IRRIGATION AND DEBRIDEMENT RIGHT ANKLE (Right) Open Reduction Internal Fixation (Orif) Calcaneous Fracture and  Ankle Medial Malleolus  (Right)  SURGEON:  Surgeon(s) and Role:    * Nicholes Stairs, MD - Primary   ASSISTANTS: none   ANESTHESIA:   general  EBL:  150 mL   BLOOD ADMINISTERED:none  DRAINS: none   LOCAL MEDICATIONS USED:  NONE  SPECIMEN:  No Specimen  DISPOSITION OF SPECIMEN:  N/A  COUNTS:  YES  TOURNIQUET:  * No tourniquets in log *  DICTATION: .Note written in EPIC  PLAN OF CARE: Admit to inpatient   PATIENT DISPOSITION:  PACU - hemodynamically stable.   Delay start of Pharmacological VTE agent (>24hrs) due to surgical blood loss or risk of bleeding: not applicable

## 2019-10-12 NOTE — Plan of Care (Addendum)
Pain getting better with administration of pain medication.

## 2019-10-12 NOTE — Progress Notes (Signed)
Subjective: 1 Day Post-Op Procedure(s) (LRB): IRRIGATION AND DEBRIDEMENT RIGHT ANKLE (Right) Open Reduction Internal Fixation (Orif) Calcaneous Fracture and  Ankle Medial Malleolus  (Right) Patient reports pain as mild.  Well controlled.  Feeling some spasm in R calf.  Tolerating regular diet.  Objective: Vital signs in last 24 hours: Temp:  [97.5 F (36.4 C)-99 F (37.2 C)] 99 F (37.2 C) (11/22 0752) Pulse Rate:  [85-124] 85 (11/22 0752) Resp:  [11-25] 18 (11/22 0752) BP: (96-169)/(52-110) 114/72 (11/22 0752) SpO2:  [93 %-100 %] 99 % (11/22 0752) Weight:  [93 kg] 93 kg (11/21 1823)  Intake/Output from previous day: 11/21 0701 - 11/22 0700 In: 2672 [I.V.:1200; IV Piggyback:1072] Out: 150 [Blood:150] Intake/Output this shift: No intake/output data recorded.  Recent Labs    10/11/19 1843  HGB 15.0   Recent Labs    10/11/19 1843  WBC 13.4*  RBC 4.70  HCT 43.5  PLT 354   Recent Labs    10/11/19 1843  NA 132*  K 3.7  CL 98  CO2 21*  BUN 12  CREATININE 1.14  GLUCOSE 94  CALCIUM 9.1   Recent Labs    10/11/19 1843  INR 1.1    PE:  wn wd male in nad.  a and O x 4.  Mood and affect normal.  EOMI.  resp unlabored.  R foot with splint in place.  Toes with brisk cap refill.  active PF and DF of the toes.  Sens to LT intact in the medial and lateral plantar n dist.   Assessment/Plan: 1 Day Post-Op Procedure(s) (LRB): IRRIGATION AND DEBRIDEMENT RIGHT ANKLE (Right) Open Reduction Internal Fixation (Orif) Calcaneous Fracture and  Ankle Medial Malleolus  (Right) Up with PT today.  NWB on the R LE.  Continue IV abx for 24 hrs post op due to open fracture.      Adam Lynch 10/12/2019, 9:23 AM
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
## 2019-10-12 NOTE — Transfer of Care (Signed)
Immediate Anesthesia Transfer of Care Note  Patient: Governor Rooks Tata  Procedure(s) Performed: IRRIGATION AND DEBRIDEMENT RIGHT ANKLE (Right ) Open Reduction Internal Fixation (Orif) Calcaneous Fracture and  Ankle Medial Malleolus  (Right Ankle)  Patient Location: PACU  Anesthesia Type:General  Level of Consciousness: awake  Airway & Oxygen Therapy: Patient Spontanous Breathing and Patient connected to face mask oxygen  Post-op Assessment: Report given to RN and Post -op Vital signs reviewed and stable  Post vital signs: Reviewed and stable  Last Vitals:  Vitals Value Taken Time  BP 128/81 10/12/19 0019  Temp 36.8 C 10/12/19 0019  Pulse 124 10/12/19 0019  Resp 25 10/12/19 0019  SpO2 100 % 10/12/19 0019    Last Pain:  Vitals:   10/11/19 1823  TempSrc:   PainSc: 0-No pain         Complications: No apparent anesthesia complications
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
## 2019-10-12 NOTE — Progress Notes (Signed)
Pt was in pain, Dr. Linna Caprice VS, ordered Ofirmev 1g IV, 1mg  of Versed, 100mg  Fen IV, precedex IV. Gave 1mg  versed, 1g of ofirmev IV and precedex40mcg IV given By Dr Linna Caprice, then pt fell in sleep. So held fentanyl .

## 2019-10-12 NOTE — Progress Notes (Signed)
Walked into patient's room to give him a [facility] treatment, patient stated that they always do that and he does not need it.  He also stated that it was a waste of money and that he rarely uses his inhaler at home.  No distress noted, will continue to monitor.
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
## 2019-10-12 NOTE — Discharge Instructions (Signed)
-  Maintain no weightbearing through the right lower extremity at all times.  Keep your splint clean and dry.  -Keep your right lower extremity elevated with your "toes above nose."  -For mild to moderate pain use Tylenol and or Aleve (or any nonsteroidal anti-inflammatory equivalent) around-the-clock.  For breakthrough pain use oxycodone as needed.  -For the prevention of blood clots take a 325 mg aspirin once per day for 6 weeks.  -Return to see Dr. Stann Mainland in 10 to 14 days for wound check.

## 2019-10-13 ENCOUNTER — Encounter (HOSPITAL_COMMUNITY): Payer: Self-pay | Admitting: Orthopedic Surgery

## 2019-10-13 LAB — CBC
HCT: 32.1 % — ABNORMAL LOW (ref 39.0–52.0)
Hemoglobin: 10.8 g/dL — ABNORMAL LOW (ref 13.0–17.0)
MCH: 32.1 pg (ref 26.0–34.0)
MCHC: 33.6 g/dL (ref 30.0–36.0)
MCV: 95.5 fL (ref 80.0–100.0)
Platelets: 281 10*3/uL (ref 150–400)
RBC: 3.36 MIL/uL — ABNORMAL LOW (ref 4.22–5.81)
RDW: 13.3 % (ref 11.5–15.5)
WBC: 11.4 10*3/uL — ABNORMAL HIGH (ref 4.0–10.5)
nRBC: 0 % (ref 0.0–0.2)

## 2019-10-13 MED ORDER — ONDANSETRON 4 MG PO TBDP: 4.0000 mg | ORAL_TABLET | Freq: Three times a day (TID) | ORAL | 0 refills | Status: DC | PRN

## 2019-10-13 MED ORDER — METHOCARBAMOL 500 MG PO TABS: 500.0000 mg | ORAL_TABLET | Freq: Four times a day (QID) | ORAL | 1 refills | Status: AC | PRN

## 2019-10-13 MED ORDER — OXYCODONE HCL 5 MG PO TABS: 5.0000 mg | ORAL_TABLET | ORAL | 0 refills | Status: DC | PRN

## 2019-10-13 NOTE — TOC Transition Note (Signed)
Transition of Care [facility]) - CM/SW Discharge Note   Patient Details  Name: Adam Lynch MRN: [id number] Date of Birth: 1956/06/30  [facility]) CM/SW Contact:  Midge Minium RN, MSN, NCM-BC, ACM-RN [phone] Phone Number: 10/13/2019, 3:01 PM   Clinical Narrative:    Patient is medically stable to transition home. CM spoke to the patient to discuss the POC. The patient lives at home with his spouse and was independent with his ADLs PTA. Patient has a significant amount of DME available at home for use. PCP/Demo verified. PT/OT eval completed with HHPT recommended, with the patient agreeable. CMS HH compare provided with [facility] selected. [place] referral given to Joen Laura RN [facility] liaison); AVS updated. Patient states having transportation at discharge and assistance at home. No further needs from CM.    Final next level of care: [place] Barriers to Discharge: No Barriers Identified   Patient Goals and CMS Choice Patient states their goals for this hospitalization and ongoing recovery are:: "to get better" CMS Medicare.gov Compare Post Acute Care list provided to:: Patient Choice offered to / list presented to : Patient   Discharge Plan and Services                DME Arranged: N/A DME Agency: NA       HH Arranged: PT [place] Agency: [facility] [employer]) Date [place]: 10/13/19 Time [place]: 1353 Representative spoke with at [place]: [place] (liaison)  Social Determinants of Health ([place]) Interventions     Readmission Risk Interventions No flowsheet data found.

## 2019-10-13 NOTE — Progress Notes (Signed)
RN notified by PT regarding Pt's soft BP. RN checked Pt, BP=85/58 while sitting, denies chest tightness and chest pain, no nausea and dizziness, held PRN pain meds and AM BP meds, encouraged PO intake, rechecked at 11:34=93/65. BP at 14:59 102/57 while sitting, no dizziness and lightheadedness.
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
## 2019-10-13 NOTE — Progress Notes (Signed)
Provided discharge education/instructions, all questions and concerns addressed, Pt not in distress, discharged home with belongings accompanied by friends.

## 2019-10-13 NOTE — Plan of Care (Signed)

## 2019-10-13 NOTE — Plan of Care (Signed)
  Problem: Clinical Measurements: Goal: Ability to maintain clinical measurements within normal limits will improve 10/13/2019 1149 by Williams Che, RN Outcome: Progressing 10/13/2019 1116 by Williams Che, RN Outcome: Progressing   Problem: Activity: Goal: Risk for activity intolerance will decrease 10/13/2019 1149 by Williams Che, RN Outcome: Progressing 10/13/2019 1116 by Williams Che, RN Outcome: Progressing   Problem: Nutrition: Goal: Adequate nutrition will be maintained 10/13/2019 1149 by Williams Che, RN Outcome: Progressing 10/13/2019 1116 by Williams Che, RN Outcome: Progressing   Problem: Coping: Goal: Level of anxiety will decrease 10/13/2019 1149 by Williams Che, RN Outcome: Progressing 10/13/2019 1116 by Williams Che, RN Outcome: Progressing   Problem: Elimination: Goal: Will not experience complications related to bowel motility 10/13/2019 1149 by Williams Che, RN Outcome: Progressing 10/13/2019 1116 by Williams Che, RN Outcome: Progressing   Problem: Pain Managment: Goal: General experience of comfort will improve 10/13/2019 1149 by Williams Che, RN Outcome: Progressing 10/13/2019 1116 by Williams Che, RN Outcome: Progressing   Problem: Safety: Goal: Ability to remain free from injury will improve 10/13/2019 1149 by Williams Che, RN Outcome: Progressing 10/13/2019 1116 by Williams Che, RN Outcome: Progressing   Problem: Skin Integrity: Goal: Risk for impaired skin integrity will decrease 10/13/2019 1149 by Williams Che, RN Outcome: Progressing 10/13/2019 1116 by Williams Che, RN Outcome: Progressing

## 2019-10-13 NOTE — Progress Notes (Signed)
   Subjective:  Patient reports pain as mild.  Feeling okay now.  No complaints of pain, shortness of breath, nausea or vomiting.  He did have some episodes of hypotension that were asymptomatic symptomatic.  No chest pain.  He feels ready to go home.  Objective:   VITALS:   Vitals:   10/13/19 0931 10/13/19 1002 10/13/19 1009 10/13/19 1134  BP: (!) 84/62 (!) 85/58  93/65  Pulse: 95 84 88 86  Resp:      Temp:      TempSrc:      SpO2:   94% 94%  Weight:      Height:        Neurologically intact ABD soft Neurovascular intact Sensation intact distally Incision: moderate drainage Compartment soft Moderate drainage along the heel from intraoperative lavage.  This is not actively draining. -Splint in place, short leg.  Lab Results  Component Value Date   WBC 13.4 (H) 10/11/2019   HGB 15.0 10/11/2019   HCT 43.5 10/11/2019   MCV 92.6 10/11/2019   PLT 354 10/11/2019   BMET    Component Value Date/Time   NA 132 (L) 10/11/2019 1843   K 3.7 10/11/2019 1843   CL 98 10/11/2019 1843   CO2 21 (L) 10/11/2019 1843   GLUCOSE 94 10/11/2019 1843   BUN 12 10/11/2019 1843   CREATININE 1.14 10/11/2019 1843   CALCIUM 9.1 10/11/2019 1843   GFRNONAA >60 10/11/2019 1843   GFRAA >60 10/11/2019 1843     Assessment/Plan: 2 Days Post-Op   Active Problems:   Displaced avulsion fracture of tuberosity of right calcaneus, initial encounter for open fracture   Fracture of calcaneus, right, open  -At this time we will continue nonweightbearing to the right lower extremity.  Strict elevation.  -Due to subjective complaints of some dizziness earlier as well as objective hypotension will check CBC.  If this is below 7 would transfuse before DC home.  Otherwise we will allow discharge home this afternoon once the lab has returned.  -He will follow-up with me in 1 week in the office.  -Daily 325 mg aspirin for DVT prophylaxis.   Nicholes Stairs 10/13/2019, 1:16 PM   Geralynn Rile,  MD [phone]

## 2019-10-13 NOTE — Progress Notes (Signed)
RT is stopping Dulera order. Patient continues to refuse. He states its a waste of money and he doesn't want to take it.

## 2019-10-13 NOTE — Care Management (Signed)
Message left with a request for Dr. Stann Mainland to enter [facility] orders with face-to-face.  Midge Minium RN, MSN, NCM-BC, ACM-RN [phone]
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
## 2019-10-14 NOTE — Anesthesia Postprocedure Evaluation (Signed)
Anesthesia Post Note  Patient: Adam Lynch  Procedure(s) Performed: IRRIGATION AND DEBRIDEMENT RIGHT ANKLE (Right ) Open Reduction Internal Fixation (Orif) Calcaneous Fracture and  Ankle Medial Malleolus  (Right Ankle)     Patient location during evaluation: PACU Anesthesia Type: General Level of consciousness: awake and alert Pain management: pain level controlled Vital Signs Assessment: post-procedure vital signs reviewed and stable Respiratory status: spontaneous breathing, nonlabored ventilation, respiratory function stable and patient connected to nasal cannula oxygen Cardiovascular status: blood pressure returned to baseline and stable Postop Assessment: no apparent nausea or vomiting Anesthetic complications: no    Last Vitals:  Vitals:   10/13/19 1328 10/13/19 1459  BP: (!) 85/51 (!) 102/57  Pulse: 85 79  Resp: 16   Temp: 37.3 C   SpO2: 96%     Last Pain:  Vitals:   10/13/19 1530  TempSrc:   PainSc: 4                  [name] COKER

## 2019-10-19 DIAGNOSIS — S82891C Other fracture of right lower leg, initial encounter for open fracture type IIIA, IIIB, or IIIC: Secondary | ICD-10-CM | POA: Diagnosis not present

## 2019-10-19 DIAGNOSIS — J449 Chronic obstructive pulmonary disease, unspecified: Secondary | ICD-10-CM | POA: Diagnosis not present

## 2019-10-19 DIAGNOSIS — S92031B Displaced avulsion fracture of tuberosity of right calcaneus, initial encounter for open fracture: Secondary | ICD-10-CM | POA: Diagnosis not present

## 2019-10-19 DIAGNOSIS — F17200 Nicotine dependence, unspecified, uncomplicated: Secondary | ICD-10-CM | POA: Diagnosis not present

## 2019-10-19 DIAGNOSIS — Z9181 History of falling: Secondary | ICD-10-CM | POA: Diagnosis not present

## 2019-10-20 DIAGNOSIS — Z0100 Encounter for examination of eyes and vision without abnormal findings: Secondary | ICD-10-CM | POA: Diagnosis not present

## 2019-10-20 DIAGNOSIS — H524 Presbyopia: Secondary | ICD-10-CM | POA: Diagnosis not present

## 2019-10-20 NOTE — Discharge Summary (Signed)
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 mL/hr over 30 Minutes Intravenous  Once 10/11/19 1831 10/11/19 1930     and started on DVT prophylaxis in the form of Lovenox.   PT was ordered for total joint protocol.  Discharge planning consulted to help with postop disposition and equipment needs.  Patient had a good night on the evening of surgery.  They started to get up OOB with therapy on day one. Continued to work with therapy into day two.   Patient was seen in rounds and was ready to go home.   Diet: Regular diet Activity:NWB Follow-up:in 7-10 days Disposition - Home Discharged Condition: good   Discharge Instructions    Call MD / Call 911   Complete by: As directed    If you experience chest pain or shortness of breath, CALL 911 and be transported to the hospital emergency room.  If you develope a fever above 101 F, pus (white drainage) or increased drainage or redness at the wound, or calf pain, call your surgeon's office.     Constipation Prevention   Complete by: As directed    Drink plenty of fluids.  Prune juice  may be helpful.  You may use a stool softener, such as Colace (over the counter) 100 mg twice a day.  Use MiraLax (over the counter) for constipation as needed.   DME Bedside commode   Complete by: As directed    Patient needs a bedside commode to treat with the following condition: Open displaced fracture of tuberosity of calcaneus, unspecified fracture morphology, unspecified laterality, initial encounter   Diet - low sodium heart healthy   Complete by: As directed    Face-to-face encounter (required for Medicare/Medicaid patients)   Complete by: As directed    I Yolonda Kida certify that this patient is under my care and that I, or a nurse practitioner or physician's assistant working with me, had a face-to-face encounter that meets the physician face-to-face encounter requirements with this patient on 10/13/2019. The encounter with the patient was in whole, or in part for the following medical condition(s) which is the primary reason for home health care (List medical condition): Open right calcaneus fracture, s/p right calcaneus ORIF   The encounter with the patient was in whole, or in part, for the following medical condition, which is the primary reason for home health care: Right open calcaneus fracture   I certify that, based on my findings, the following services are medically necessary home health services: Physical therapy   Reason for Medically Necessary Home Health Services: Therapy- Therapeutic Exercises to Increase Strength and Endurance   My clinical findings support the need for the above services: Unsafe ambulation due to balance issues   Further, I certify that my clinical findings support that this patient is homebound due to: Unable to leave home safely without assistance   Home Health   Complete by: As directed    To provide the following care/treatments: PT   Increase  activity slowly as tolerated   Complete by: As directed      Allergies as of 10/13/2019      Reactions   Morphine And Related Other (See Comments)   Skin crawls - like ants on skin      Medication List    TAKE these medications   budesonide-formoterol 160-4.5 MCG/ACT inhaler Commonly known as: SYMBICORT Inhale 2 puffs into the lungs daily as needed (shortness of breath). Notes to patient: Resume home regimen   clonazePAM 1 MG tablet Commonly known as: KLONOPIN Take 1 mg by mouth 2 (two) times daily.   clotrimazole 1 % cream Commonly known as: LOTRIMIN Apply 1 application topically 2 (two) times daily. For athlete's foot Notes to patient: Resume home regimen   esomeprazole 40 MG capsule Commonly known as: NEXIUM Take 40 mg by mouth every morning.   Hair/Skin/Nails/Biotin Tabs Take 1 tablet by mouth every morning. Notes to patient: Resume home regimen   methocarbamol 500 MG tablet Commonly known as: ROBAXIN Take 1 tablet (500 mg total) by mouth every 6 (six) hours as needed for muscle spasms.   naproxen sodium 220 MG tablet Commonly known as: ALEVE Take 440 mg by mouth daily as needed (pain).   ondansetron 4 MG disintegrating tablet Commonly known as: Zofran ODT Take 1 tablet (4 mg total) by mouth every 8 (eight) hours as needed.   oxyCODONE 5 MG immediate release tablet Commonly known as: Roxicodone Take 1 tablet (5 mg total) by mouth every 4 (four) hours as needed for severe pain.   valsartan-hydrochlorothiazide 320-12.5 MG tablet Commonly known as: DIOVAN-HCT Take 1 tablet by mouth every morning.  Durable Medical Equipment  (From admission, onward)         Start     Ordered   10/13/19 0000  DME Bedside commode    Question:  Patient needs a bedside commode to treat with the following condition  Answer:  Open displaced fracture of tuberosity of calcaneus, unspecified fracture morphology, unspecified laterality, initial encounter   10/13/19  1528         Follow-up Information    Yolonda Kida, MD In 11 days.   Specialty: Orthopedic Surgery Why: For suture removal, For wound re-check Contact information: [address] [place] [address] [phone]        Home, [facility] Follow up.   Specialty: Home Health Services Why: Home Health physical therapy Contact information: [address] [place] [address] [phone]           Signed: Maryan Rued, MD Orthopaedic Surgery 10/20/2019, 5:30 PM

## 2019-10-21 DIAGNOSIS — S92031B Displaced avulsion fracture of tuberosity of right calcaneus, initial encounter for open fracture: Secondary | ICD-10-CM | POA: Diagnosis not present

## 2019-10-21 DIAGNOSIS — S92041B Displaced other fracture of tuberosity of right calcaneus, initial encounter for open fracture: Secondary | ICD-10-CM | POA: Diagnosis not present

## 2019-10-21 DIAGNOSIS — F17200 Nicotine dependence, unspecified, uncomplicated: Secondary | ICD-10-CM | POA: Diagnosis not present

## 2019-10-21 DIAGNOSIS — S82891C Other fracture of right lower leg, initial encounter for open fracture type IIIA, IIIB, or IIIC: Secondary | ICD-10-CM | POA: Diagnosis not present

## 2019-10-21 DIAGNOSIS — Z9181 History of falling: Secondary | ICD-10-CM | POA: Diagnosis not present

## 2019-10-21 DIAGNOSIS — J449 Chronic obstructive pulmonary disease, unspecified: Secondary | ICD-10-CM | POA: Diagnosis not present

## 2019-10-21 DIAGNOSIS — Z4789 Encounter for other orthopedic aftercare: Secondary | ICD-10-CM | POA: Diagnosis not present

## 2019-10-24 DIAGNOSIS — J449 Chronic obstructive pulmonary disease, unspecified: Secondary | ICD-10-CM | POA: Diagnosis not present

## 2019-10-24 DIAGNOSIS — F17200 Nicotine dependence, unspecified, uncomplicated: Secondary | ICD-10-CM | POA: Diagnosis not present

## 2019-10-24 DIAGNOSIS — Z9181 History of falling: Secondary | ICD-10-CM | POA: Diagnosis not present

## 2019-10-24 DIAGNOSIS — S92031B Displaced avulsion fracture of tuberosity of right calcaneus, initial encounter for open fracture: Secondary | ICD-10-CM | POA: Diagnosis not present

## 2019-10-24 DIAGNOSIS — S82891C Other fracture of right lower leg, initial encounter for open fracture type IIIA, IIIB, or IIIC: Secondary | ICD-10-CM | POA: Diagnosis not present

## 2019-10-27 DIAGNOSIS — S92041B Displaced other fracture of tuberosity of right calcaneus, initial encounter for open fracture: Secondary | ICD-10-CM | POA: Diagnosis not present

## 2019-10-27 DIAGNOSIS — Z4789 Encounter for other orthopedic aftercare: Secondary | ICD-10-CM | POA: Diagnosis not present

## 2019-11-03 DIAGNOSIS — S82891C Other fracture of right lower leg, initial encounter for open fracture type IIIA, IIIB, or IIIC: Secondary | ICD-10-CM | POA: Diagnosis not present

## 2019-11-03 DIAGNOSIS — F17200 Nicotine dependence, unspecified, uncomplicated: Secondary | ICD-10-CM | POA: Diagnosis not present

## 2019-11-03 DIAGNOSIS — Z9181 History of falling: Secondary | ICD-10-CM | POA: Diagnosis not present

## 2019-11-03 DIAGNOSIS — J449 Chronic obstructive pulmonary disease, unspecified: Secondary | ICD-10-CM | POA: Diagnosis not present

## 2019-11-03 DIAGNOSIS — S92031B Displaced avulsion fracture of tuberosity of right calcaneus, initial encounter for open fracture: Secondary | ICD-10-CM | POA: Diagnosis not present

## 2019-11-05 ENCOUNTER — Telehealth: Payer: Self-pay

## 2019-11-05 NOTE — Telephone Encounter (Signed)
Dr. Sharol Given called to advise this pt will be calling for an appt tomorrow with Dr. Sharol Given for a calcaneal fracture. FYI

## 2019-11-10 DIAGNOSIS — S92031B Displaced avulsion fracture of tuberosity of right calcaneus, initial encounter for open fracture: Secondary | ICD-10-CM | POA: Diagnosis not present

## 2019-11-10 DIAGNOSIS — F17200 Nicotine dependence, unspecified, uncomplicated: Secondary | ICD-10-CM | POA: Diagnosis not present

## 2019-11-10 DIAGNOSIS — J449 Chronic obstructive pulmonary disease, unspecified: Secondary | ICD-10-CM | POA: Diagnosis not present

## 2019-11-10 DIAGNOSIS — S82891C Other fracture of right lower leg, initial encounter for open fracture type IIIA, IIIB, or IIIC: Secondary | ICD-10-CM | POA: Diagnosis not present

## 2019-11-10 DIAGNOSIS — Z9181 History of falling: Secondary | ICD-10-CM | POA: Diagnosis not present

## 2019-11-12 ENCOUNTER — Encounter (HOSPITAL_COMMUNITY): Payer: Self-pay

## 2019-11-12 ENCOUNTER — Inpatient Hospital Stay (HOSPITAL_COMMUNITY)
Admission: EM | Admit: 2019-11-12 | Discharge: 2019-11-20 | DRG: 856 | Disposition: A | Discharge status: Home Health | Payer: Medicare HMO | Attending: Internal Medicine | Admitting: Internal Medicine

## 2019-11-12 ENCOUNTER — Emergency Department (HOSPITAL_COMMUNITY): Payer: Medicare HMO

## 2019-11-12 ENCOUNTER — Other Ambulatory Visit: Payer: Self-pay

## 2019-11-12 DIAGNOSIS — E785 Hyperlipidemia, unspecified: Secondary | ICD-10-CM | POA: Diagnosis present

## 2019-11-12 DIAGNOSIS — E8809 Other disorders of plasma-protein metabolism, not elsewhere classified: Secondary | ICD-10-CM | POA: Diagnosis present

## 2019-11-12 DIAGNOSIS — R6520 Severe sepsis without septic shock: Secondary | ICD-10-CM | POA: Diagnosis not present

## 2019-11-12 DIAGNOSIS — S299XXA Unspecified injury of thorax, initial encounter: Secondary | ICD-10-CM | POA: Diagnosis not present

## 2019-11-12 DIAGNOSIS — J449 Chronic obstructive pulmonary disease, unspecified: Secondary | ICD-10-CM | POA: Diagnosis not present

## 2019-11-12 DIAGNOSIS — T8141XA Infection following a procedure, superficial incisional surgical site, initial encounter: Secondary | ICD-10-CM | POA: Diagnosis present

## 2019-11-12 DIAGNOSIS — S92031B Displaced avulsion fracture of tuberosity of right calcaneus, initial encounter for open fracture: Secondary | ICD-10-CM | POA: Diagnosis not present

## 2019-11-12 DIAGNOSIS — Z96652 Presence of left artificial knee joint: Secondary | ICD-10-CM | POA: Diagnosis present

## 2019-11-12 DIAGNOSIS — S8251XA Displaced fracture of medial malleolus of right tibia, initial encounter for closed fracture: Secondary | ICD-10-CM | POA: Diagnosis not present

## 2019-11-12 DIAGNOSIS — Z9049 Acquired absence of other specified parts of digestive tract: Secondary | ICD-10-CM | POA: Diagnosis not present

## 2019-11-12 DIAGNOSIS — S82891C Other fracture of right lower leg, initial encounter for open fracture type IIIA, IIIB, or IIIC: Secondary | ICD-10-CM | POA: Diagnosis not present

## 2019-11-12 DIAGNOSIS — F419 Anxiety disorder, unspecified: Secondary | ICD-10-CM | POA: Diagnosis present

## 2019-11-12 DIAGNOSIS — N179 Acute kidney failure, unspecified: Secondary | ICD-10-CM | POA: Diagnosis present

## 2019-11-12 DIAGNOSIS — M86171 Other acute osteomyelitis, right ankle and foot: Secondary | ICD-10-CM

## 2019-11-12 DIAGNOSIS — T8149XA Infection following a procedure, other surgical site, initial encounter: Secondary | ICD-10-CM | POA: Diagnosis not present

## 2019-11-12 DIAGNOSIS — L97919 Non-pressure chronic ulcer of unspecified part of right lower leg with unspecified severity: Secondary | ICD-10-CM | POA: Diagnosis not present

## 2019-11-12 DIAGNOSIS — Z9181 History of falling: Secondary | ICD-10-CM | POA: Diagnosis not present

## 2019-11-12 DIAGNOSIS — B999 Unspecified infectious disease: Secondary | ICD-10-CM | POA: Diagnosis not present

## 2019-11-12 DIAGNOSIS — Z885 Allergy status to narcotic agent status: Secondary | ICD-10-CM

## 2019-11-12 DIAGNOSIS — I10 Essential (primary) hypertension: Secondary | ICD-10-CM | POA: Diagnosis present

## 2019-11-12 DIAGNOSIS — M7989 Other specified soft tissue disorders: Secondary | ICD-10-CM | POA: Diagnosis not present

## 2019-11-12 DIAGNOSIS — Z79899 Other long term (current) drug therapy: Secondary | ICD-10-CM | POA: Diagnosis not present

## 2019-11-12 DIAGNOSIS — Z20828 Contact with and (suspected) exposure to other viral communicable diseases: Secondary | ICD-10-CM | POA: Diagnosis present

## 2019-11-12 DIAGNOSIS — I959 Hypotension, unspecified: Secondary | ICD-10-CM | POA: Diagnosis present

## 2019-11-12 DIAGNOSIS — D649 Anemia, unspecified: Secondary | ICD-10-CM | POA: Diagnosis present

## 2019-11-12 DIAGNOSIS — Z79891 Long term (current) use of opiate analgesic: Secondary | ICD-10-CM

## 2019-11-12 DIAGNOSIS — Z7982 Long term (current) use of aspirin: Secondary | ICD-10-CM | POA: Diagnosis not present

## 2019-11-12 DIAGNOSIS — L089 Local infection of the skin and subcutaneous tissue, unspecified: Secondary | ICD-10-CM

## 2019-11-12 DIAGNOSIS — Z6827 Body mass index (BMI) 27.0-27.9, adult: Secondary | ICD-10-CM

## 2019-11-12 DIAGNOSIS — L02611 Cutaneous abscess of right foot: Secondary | ICD-10-CM | POA: Diagnosis present

## 2019-11-12 DIAGNOSIS — Z7951 Long term (current) use of inhaled steroids: Secondary | ICD-10-CM | POA: Diagnosis not present

## 2019-11-12 DIAGNOSIS — T8130XA Disruption of wound, unspecified, initial encounter: Secondary | ICD-10-CM | POA: Diagnosis present

## 2019-11-12 DIAGNOSIS — I96 Gangrene, not elsewhere classified: Secondary | ICD-10-CM | POA: Diagnosis not present

## 2019-11-12 DIAGNOSIS — F17200 Nicotine dependence, unspecified, uncomplicated: Secondary | ICD-10-CM | POA: Diagnosis present

## 2019-11-12 DIAGNOSIS — A419 Sepsis, unspecified organism: Secondary | ICD-10-CM | POA: Diagnosis present

## 2019-11-12 DIAGNOSIS — G8918 Other acute postprocedural pain: Secondary | ICD-10-CM | POA: Diagnosis not present

## 2019-11-12 DIAGNOSIS — S92031G Displaced avulsion fracture of tuberosity of right calcaneus, subsequent encounter for fracture with delayed healing: Secondary | ICD-10-CM

## 2019-11-12 DIAGNOSIS — I743 Embolism and thrombosis of arteries of the lower extremities: Secondary | ICD-10-CM | POA: Diagnosis not present

## 2019-11-12 DIAGNOSIS — T148XXA Other injury of unspecified body region, initial encounter: Secondary | ICD-10-CM | POA: Diagnosis not present

## 2019-11-12 DIAGNOSIS — E43 Unspecified severe protein-calorie malnutrition: Secondary | ICD-10-CM | POA: Diagnosis present

## 2019-11-12 DIAGNOSIS — M1990 Unspecified osteoarthritis, unspecified site: Secondary | ICD-10-CM | POA: Diagnosis present

## 2019-11-12 DIAGNOSIS — I70261 Atherosclerosis of native arteries of extremities with gangrene, right leg: Secondary | ICD-10-CM | POA: Diagnosis not present

## 2019-11-12 DIAGNOSIS — R5381 Other malaise: Secondary | ICD-10-CM | POA: Diagnosis not present

## 2019-11-12 DIAGNOSIS — T8140XA Infection following a procedure, unspecified, initial encounter: Secondary | ICD-10-CM | POA: Diagnosis not present

## 2019-11-12 LAB — CBC WITH DIFFERENTIAL/PLATELET
Abs Immature Granulocytes: 0.05 10*3/uL (ref 0.00–0.07)
Basophils Absolute: 0.1 10*3/uL (ref 0.0–0.1)
Basophils Relative: 1 %
Eosinophils Absolute: 0.1 10*3/uL (ref 0.0–0.5)
Eosinophils Relative: 1 %
HCT: 36.6 % — ABNORMAL LOW (ref 39.0–52.0)
Hemoglobin: 11.6 g/dL — ABNORMAL LOW (ref 13.0–17.0)
Immature Granulocytes: 1 %
Lymphocytes Relative: 18 %
Lymphs Abs: 1.7 10*3/uL (ref 0.7–4.0)
MCH: 30.7 pg (ref 26.0–34.0)
MCHC: 31.7 g/dL (ref 30.0–36.0)
MCV: 96.8 fL (ref 80.0–100.0)
Monocytes Absolute: 0.7 10*3/uL (ref 0.1–1.0)
Monocytes Relative: 8 %
Neutro Abs: 6.7 10*3/uL (ref 1.7–7.7)
Neutrophils Relative %: 71 %
Platelets: 461 10*3/uL — ABNORMAL HIGH (ref 150–400)
RBC: 3.78 MIL/uL — ABNORMAL LOW (ref 4.22–5.81)
RDW: 13.2 % (ref 11.5–15.5)
WBC: 9.4 10*3/uL (ref 4.0–10.5)
nRBC: 0 % (ref 0.0–0.2)

## 2019-11-12 LAB — COMPREHENSIVE METABOLIC PANEL
ALT: 22 U/L (ref 0–44)
AST: 27 U/L (ref 15–41)
Albumin: 3.1 g/dL — ABNORMAL LOW (ref 3.5–5.0)
Alkaline Phosphatase: 70 U/L (ref 38–126)
Anion gap: 15 (ref 5–15)
BUN: 58 mg/dL — ABNORMAL HIGH (ref 8–23)
CO2: 19 mmol/L — ABNORMAL LOW (ref 22–32)
Calcium: 9.1 mg/dL (ref 8.9–10.3)
Chloride: 104 mmol/L (ref 98–111)
Creatinine, Ser: 4.76 mg/dL — ABNORMAL HIGH (ref 0.61–1.24)
GFR calc Af Amer: 14 mL/min — ABNORMAL LOW (ref >60)
GFR calc non Af Amer: 12 mL/min — ABNORMAL LOW (ref >60)
Glucose, Bld: 96 mg/dL (ref 70–99)
Potassium: 4.8 mmol/L (ref 3.5–5.1)
Sodium: 138 mmol/L (ref 135–145)
Total Bilirubin: 1.3 mg/dL — ABNORMAL HIGH (ref 0.3–1.2)
Total Protein: 7.4 g/dL (ref 6.5–8.1)

## 2019-11-12 LAB — LACTIC ACID, PLASMA: Lactic Acid, Venous: 1.6 mmol/L (ref 0.5–1.9)
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
## 2019-11-13 ENCOUNTER — Inpatient Hospital Stay (HOSPITAL_COMMUNITY): Payer: Medicare HMO

## 2019-11-13 ENCOUNTER — Encounter (HOSPITAL_COMMUNITY): Payer: Self-pay | Admitting: Internal Medicine

## 2019-11-13 ENCOUNTER — Emergency Department (HOSPITAL_COMMUNITY): Payer: Medicare HMO

## 2019-11-13 ENCOUNTER — Inpatient Hospital Stay (HOSPITAL_COMMUNITY): Payer: Medicare HMO | Admitting: Anesthesiology

## 2019-11-13 ENCOUNTER — Encounter (HOSPITAL_COMMUNITY)
Admission: EM | Disposition: A | Discharge status: Home Health | Payer: Self-pay | Source: Home / Self Care | Attending: Internal Medicine

## 2019-11-13 DIAGNOSIS — Z9049 Acquired absence of other specified parts of digestive tract: Secondary | ICD-10-CM | POA: Diagnosis not present

## 2019-11-13 DIAGNOSIS — A419 Sepsis, unspecified organism: Secondary | ICD-10-CM | POA: Diagnosis present

## 2019-11-13 DIAGNOSIS — D649 Anemia, unspecified: Secondary | ICD-10-CM | POA: Diagnosis present

## 2019-11-13 DIAGNOSIS — T8149XA Infection following a procedure, other surgical site, initial encounter: Secondary | ICD-10-CM | POA: Diagnosis present

## 2019-11-13 DIAGNOSIS — E43 Unspecified severe protein-calorie malnutrition: Secondary | ICD-10-CM | POA: Diagnosis present

## 2019-11-13 DIAGNOSIS — N179 Acute kidney failure, unspecified: Secondary | ICD-10-CM

## 2019-11-13 DIAGNOSIS — I959 Hypotension, unspecified: Secondary | ICD-10-CM | POA: Diagnosis present

## 2019-11-13 DIAGNOSIS — R6520 Severe sepsis without septic shock: Secondary | ICD-10-CM | POA: Diagnosis not present

## 2019-11-13 DIAGNOSIS — Z7951 Long term (current) use of inhaled steroids: Secondary | ICD-10-CM | POA: Diagnosis not present

## 2019-11-13 DIAGNOSIS — Z7982 Long term (current) use of aspirin: Secondary | ICD-10-CM | POA: Diagnosis not present

## 2019-11-13 DIAGNOSIS — I10 Essential (primary) hypertension: Secondary | ICD-10-CM | POA: Diagnosis present

## 2019-11-13 DIAGNOSIS — E8809 Other disorders of plasma-protein metabolism, not elsewhere classified: Secondary | ICD-10-CM | POA: Diagnosis present

## 2019-11-13 DIAGNOSIS — T8141XA Infection following a procedure, superficial incisional surgical site, initial encounter: Secondary | ICD-10-CM | POA: Diagnosis present

## 2019-11-13 DIAGNOSIS — L02611 Cutaneous abscess of right foot: Secondary | ICD-10-CM | POA: Diagnosis present

## 2019-11-13 DIAGNOSIS — J449 Chronic obstructive pulmonary disease, unspecified: Secondary | ICD-10-CM | POA: Diagnosis present

## 2019-11-13 DIAGNOSIS — Z20828 Contact with and (suspected) exposure to other viral communicable diseases: Secondary | ICD-10-CM | POA: Diagnosis present

## 2019-11-13 DIAGNOSIS — Z79899 Other long term (current) drug therapy: Secondary | ICD-10-CM | POA: Diagnosis not present

## 2019-11-13 DIAGNOSIS — Z96652 Presence of left artificial knee joint: Secondary | ICD-10-CM | POA: Diagnosis present

## 2019-11-13 DIAGNOSIS — M1990 Unspecified osteoarthritis, unspecified site: Secondary | ICD-10-CM | POA: Diagnosis present

## 2019-11-13 DIAGNOSIS — E785 Hyperlipidemia, unspecified: Secondary | ICD-10-CM | POA: Diagnosis present

## 2019-11-13 DIAGNOSIS — Z6827 Body mass index (BMI) 27.0-27.9, adult: Secondary | ICD-10-CM | POA: Diagnosis not present

## 2019-11-13 DIAGNOSIS — F17200 Nicotine dependence, unspecified, uncomplicated: Secondary | ICD-10-CM | POA: Diagnosis present

## 2019-11-13 DIAGNOSIS — T148XXA Other injury of unspecified body region, initial encounter: Secondary | ICD-10-CM | POA: Diagnosis not present

## 2019-11-13 DIAGNOSIS — T8130XA Disruption of wound, unspecified, initial encounter: Secondary | ICD-10-CM | POA: Diagnosis present

## 2019-11-13 DIAGNOSIS — F419 Anxiety disorder, unspecified: Secondary | ICD-10-CM | POA: Diagnosis present

## 2019-11-13 DIAGNOSIS — S92031G Displaced avulsion fracture of tuberosity of right calcaneus, subsequent encounter for fracture with delayed healing: Secondary | ICD-10-CM | POA: Diagnosis not present

## 2019-11-13 DIAGNOSIS — M86171 Other acute osteomyelitis, right ankle and foot: Secondary | ICD-10-CM | POA: Diagnosis present

## 2019-11-13 HISTORY — PX: I & D EXTREMITY: SHX5045

## 2019-11-13 LAB — URINALYSIS, ROUTINE W REFLEX MICROSCOPIC
Bilirubin Urine: NEGATIVE
Glucose, UA: NEGATIVE mg/dL
Hgb urine dipstick: NEGATIVE
Ketones, ur: 5 mg/dL — AB
Leukocytes,Ua: NEGATIVE
Nitrite: NEGATIVE
Protein, ur: NEGATIVE mg/dL
Specific Gravity, Urine: 1.016 (ref 1.005–1.030)
pH: 5 (ref 5.0–8.0)

## 2019-11-13 LAB — SARS CORONAVIRUS 2 (TAT 6-24 HRS): SARS Coronavirus 2: NEGATIVE

## 2019-11-13 LAB — LACTIC ACID, PLASMA
Lactic Acid, Venous: 1.6 mmol/L (ref 0.5–1.9)
Lactic Acid, Venous: 0.9 mmol/L (ref 0.5–1.9)

## 2019-11-13 LAB — POC SARS CORONAVIRUS 2 AG -  ED: SARS Coronavirus 2 Ag: NEGATIVE

## 2019-11-13 LAB — CK: Total CK: 203 U/L (ref 49–397)

## 2019-11-13 LAB — SODIUM, URINE, RANDOM: Sodium, Ur: 55 mmol/L

## 2019-11-13 LAB — PROTIME-INR
INR: 1.2 (ref 0.8–1.2)
Prothrombin Time: 14.9 seconds (ref 11.4–15.2)

## 2019-11-13 LAB — CREATININE, URINE, RANDOM: Creatinine, Urine: 130.58 mg/dL

## 2019-11-13 LAB — APTT: aPTT: 36 seconds (ref 24–36)

## 2019-11-13 SURGERY — IRRIGATION AND DEBRIDEMENT EXTREMITY
Anesthesia: General | Laterality: Right

## 2019-11-13 MED ORDER — ONDANSETRON HCL 4 MG/2ML IJ SOLN
INTRAMUSCULAR | Status: AC
  Filled 2019-11-13: qty 2

## 2019-11-13 MED ORDER — SUCCINYLCHOLINE CHLORIDE 200 MG/10ML IV SOSY
PREFILLED_SYRINGE | INTRAVENOUS | Status: AC
  Filled 2019-11-13: qty 10

## 2019-11-13 MED ORDER — METOCLOPRAMIDE HCL 5 MG/ML IJ SOLN: 5.0000 mg | Freq: Three times a day (TID) | INTRAMUSCULAR | Status: DC | PRN

## 2019-11-13 MED ORDER — DOCUSATE SODIUM 100 MG PO CAPS
100.0000 mg | ORAL_CAPSULE | Freq: Two times a day (BID) | ORAL | Status: DC
  Administered 2019-11-14 – 2019-11-20 (×11): 100 mg via ORAL
  Filled 2019-11-13 (×14): qty 1

## 2019-11-13 MED ORDER — LACTATED RINGERS IV BOLUS (SEPSIS)
1000.0000 mL | Freq: Once | INTRAVENOUS | Status: AC
  Administered 2019-11-13: 1000 mL via INTRAVENOUS

## 2019-11-13 MED ORDER — PROPOFOL 10 MG/ML IV BOLUS
INTRAVENOUS | Status: DC | PRN
  Administered 2019-11-13: 200 mg via INTRAVENOUS

## 2019-11-13 MED ORDER — ONDANSETRON HCL 4 MG/2ML IJ SOLN
4.0000 mg | Freq: Four times a day (QID) | INTRAMUSCULAR | Status: DC | PRN
  Administered 2019-11-13: 4 mg via INTRAVENOUS

## 2019-11-13 MED ORDER — LIDOCAINE HCL (CARDIAC) PF 100 MG/5ML IV SOSY
PREFILLED_SYRINGE | INTRAVENOUS | Status: DC | PRN
  Administered 2019-11-13: 30 mg via INTRAVENOUS

## 2019-11-13 MED ORDER — ACETAMINOPHEN 650 MG RE SUPP: 650.0000 mg | Freq: Four times a day (QID) | RECTAL | Status: DC | PRN

## 2019-11-13 MED ORDER — CHLORHEXIDINE GLUCONATE 4 % EX LIQD: 60.0000 mL | Freq: Once | CUTANEOUS | Status: DC

## 2019-11-13 MED ORDER — MIDAZOLAM HCL 2 MG/2ML IJ SOLN
INTRAMUSCULAR | Status: AC
  Filled 2019-11-13: qty 2

## 2019-11-13 MED ORDER — ACETAMINOPHEN 325 MG PO TABS: 650.0000 mg | ORAL_TABLET | Freq: Four times a day (QID) | ORAL | Status: DC | PRN

## 2019-11-13 MED ORDER — CEFAZOLIN SODIUM-DEXTROSE 2-4 GM/100ML-% IV SOLN
2.0000 g | INTRAVENOUS | Status: AC
  Administered 2019-11-13: 2 g via INTRAVENOUS

## 2019-11-13 MED ORDER — PHENYLEPHRINE 40 MCG/ML (10ML) SYRINGE FOR IV PUSH (FOR BLOOD PRESSURE SUPPORT)
PREFILLED_SYRINGE | INTRAVENOUS | Status: AC
  Filled 2019-11-13: qty 10

## 2019-11-13 MED ORDER — PROPOFOL 10 MG/ML IV BOLUS
INTRAVENOUS | Status: AC
  Filled 2019-11-13: qty 20

## 2019-11-13 MED ORDER — ROCURONIUM BROMIDE 10 MG/ML (PF) SYRINGE
PREFILLED_SYRINGE | INTRAVENOUS | Status: AC
  Filled 2019-11-13: qty 10

## 2019-11-13 MED ORDER — SODIUM CHLORIDE 0.9 % IV SOLN: INTRAVENOUS | Status: DC

## 2019-11-13 MED ORDER — ONDANSETRON HCL 4 MG PO TABS: 4.0000 mg | ORAL_TABLET | Freq: Four times a day (QID) | ORAL | Status: DC | PRN

## 2019-11-13 MED ORDER — SODIUM CHLORIDE 0.9 % IR SOLN
Status: DC | PRN
  Administered 2019-11-13 (×2): 3000 mL

## 2019-11-13 MED ORDER — VANCOMYCIN VARIABLE DOSE PER UNSTABLE RENAL FUNCTION (PHARMACIST DOSING): Status: DC

## 2019-11-13 MED ORDER — ONDANSETRON HCL 4 MG/2ML IJ SOLN: 4.0000 mg | Freq: Four times a day (QID) | INTRAMUSCULAR | Status: DC | PRN

## 2019-11-13 MED ORDER — SODIUM CHLORIDE 0.9 % IV SOLN
1.0000 g | INTRAVENOUS | Status: DC
  Administered 2019-11-14 – 2019-11-16 (×3): 1 g via INTRAVENOUS
  Filled 2019-11-13 (×4): qty 1

## 2019-11-13 MED ORDER — HEPARIN SODIUM (PORCINE) 5000 UNIT/ML IJ SOLN
5000.0000 [IU] | Freq: Three times a day (TID) | INTRAMUSCULAR | Status: DC
  Administered 2019-11-13 – 2019-11-14 (×4): 5000 [IU] via SUBCUTANEOUS
  Filled 2019-11-13 (×6): qty 1

## 2019-11-13 MED ORDER — 0.9 % SODIUM CHLORIDE (POUR BTL) OPTIME
TOPICAL | Status: DC | PRN
  Administered 2019-11-13: 1000 mL

## 2019-11-13 MED ORDER — VANCOMYCIN HCL IN DEXTROSE 1-5 GM/200ML-% IV SOLN: 1000.0000 mg | Freq: Once | INTRAVENOUS | Status: DC

## 2019-11-13 MED ORDER — HYDROCODONE-ACETAMINOPHEN 5-325 MG PO TABS
1.0000 | ORAL_TABLET | ORAL | Status: DC | PRN
  Administered 2019-11-15 – 2019-11-17 (×3): 2 via ORAL
  Filled 2019-11-13 (×3): qty 2

## 2019-11-13 MED ORDER — ROCURONIUM BROMIDE 100 MG/10ML IV SOLN
INTRAVENOUS | Status: DC | PRN
  Administered 2019-11-13: 30 mg via INTRAVENOUS

## 2019-11-13 MED ORDER — METOCLOPRAMIDE HCL 5 MG PO TABS: 5.0000 mg | ORAL_TABLET | Freq: Three times a day (TID) | ORAL | Status: DC | PRN

## 2019-11-13 MED ORDER — MORPHINE SULFATE (PF) 2 MG/ML IV SOLN: 0.5000 mg | INTRAVENOUS | Status: DC | PRN

## 2019-11-13 MED ORDER — FENTANYL CITRATE (PF) 100 MCG/2ML IJ SOLN
INTRAMUSCULAR | Status: DC | PRN
  Administered 2019-11-13: 100 ug via INTRAVENOUS

## 2019-11-13 MED ORDER — VANCOMYCIN HCL 2000 MG/400ML IV SOLN
2000.0000 mg | Freq: Once | INTRAVENOUS | Status: AC
  Administered 2019-11-13: 2000 mg via INTRAVENOUS
  Filled 2019-11-13: qty 400

## 2019-11-13 MED ORDER — FENTANYL CITRATE (PF) 250 MCG/5ML IJ SOLN
INTRAMUSCULAR | Status: AC
  Filled 2019-11-13: qty 5

## 2019-11-13 MED ORDER — POVIDONE-IODINE 10 % EX SWAB: 2.0000 "application " | Freq: Once | CUTANEOUS | Status: DC

## 2019-11-13 MED ORDER — LIDOCAINE 2% (20 MG/ML) 5 ML SYRINGE
INTRAMUSCULAR | Status: AC
  Filled 2019-11-13: qty 5

## 2019-11-13 MED ORDER — HYDROCODONE-ACETAMINOPHEN 7.5-325 MG PO TABS
1.0000 | ORAL_TABLET | ORAL | Status: DC | PRN
  Administered 2019-11-13 – 2019-11-20 (×11): 2 via ORAL
  Filled 2019-11-13 (×11): qty 2

## 2019-11-13 MED ORDER — SUCCINYLCHOLINE CHLORIDE 20 MG/ML IJ SOLN
INTRAMUSCULAR | Status: DC | PRN
  Administered 2019-11-13: 100 mg via INTRAVENOUS

## 2019-11-13 MED ORDER — METRONIDAZOLE IN NACL 5-0.79 MG/ML-% IV SOLN
500.0000 mg | Freq: Once | INTRAVENOUS | Status: AC
  Administered 2019-11-13: 500 mg via INTRAVENOUS
  Filled 2019-11-13: qty 100

## 2019-11-13 MED ORDER — ACETAMINOPHEN 325 MG PO TABS: 325.0000 mg | ORAL_TABLET | Freq: Four times a day (QID) | ORAL | Status: DC | PRN

## 2019-11-13 MED ORDER — FENTANYL CITRATE (PF) 100 MCG/2ML IJ SOLN
25.0000 ug | INTRAMUSCULAR | Status: DC | PRN
  Administered 2019-11-18 – 2019-11-19 (×5): 25 ug via INTRAVENOUS
  Filled 2019-11-13 (×5): qty 2

## 2019-11-13 MED ORDER — MIDAZOLAM HCL 5 MG/5ML IJ SOLN
INTRAMUSCULAR | Status: DC | PRN
  Administered 2019-11-13: 1 mg via INTRAVENOUS

## 2019-11-13 MED ORDER — ALBUTEROL SULFATE (2.5 MG/3ML) 0.083% IN NEBU: 2.5000 mg | INHALATION_SOLUTION | Freq: Four times a day (QID) | RESPIRATORY_TRACT | Status: DC | PRN

## 2019-11-13 MED ORDER — SODIUM CHLORIDE 0.9 % IV SOLN
2.0000 g | Freq: Once | INTRAVENOUS | Status: AC
  Administered 2019-11-13: 2 g via INTRAVENOUS
  Filled 2019-11-13: qty 2

## 2019-11-13 SURGICAL SUPPLY — 71 items
ALCOHOL 70% 16 OZ (MISCELLANEOUS) ×1 IMPLANT
BLADE SURG 10 STRL SS (BLADE) ×3 IMPLANT
BNDG COHESIVE 1X5 TAN STRL LF (GAUZE/BANDAGES/DRESSINGS) IMPLANT
BNDG COHESIVE 4X5 TAN STRL (GAUZE/BANDAGES/DRESSINGS) ×1 IMPLANT
BNDG COHESIVE 6X5 TAN STRL LF (GAUZE/BANDAGES/DRESSINGS) ×4 IMPLANT
BNDG CONFORM 3 STRL LF (GAUZE/BANDAGES/DRESSINGS) IMPLANT
BNDG ELASTIC 3X5.8 VLCR STR LF (GAUZE/BANDAGES/DRESSINGS) IMPLANT
BNDG GAUZE ELAST 4 BULKY (GAUZE/BANDAGES/DRESSINGS) ×9 IMPLANT
CANISTER WOUND CARE 500ML ATS (WOUND CARE) ×2 IMPLANT
CORD BIPOLAR FORCEPS 12FT (ELECTRODE) IMPLANT
COVER SURGICAL LIGHT HANDLE (MISCELLANEOUS) ×3 IMPLANT
COVER WAND RF STERILE (DRAPES) ×1 IMPLANT
CUFF TOURN SGL QUICK 24 (TOURNIQUET CUFF)
CUFF TOURN SGL QUICK 34 (TOURNIQUET CUFF)
CUFF TOURN SGL QUICK 42 (TOURNIQUET CUFF) IMPLANT
CUFF TRNQT CYL 24X4X16.5-23 (TOURNIQUET CUFF) IMPLANT
CUFF TRNQT CYL 34X4.125X (TOURNIQUET CUFF) ×2 IMPLANT
DRAPE EXTREMITY BILATERAL (DRAPES) IMPLANT
DRAPE IMP U-DRAPE 54X76 (DRAPES) IMPLANT
DRAPE INCISE IOBAN 66X45 STRL (DRAPES) ×6 IMPLANT
DRAPE SURG 17X23 STRL (DRAPES) IMPLANT
DRAPE U-SHAPE 47X51 STRL (DRAPES) ×3 IMPLANT
DRSG PAD ABDOMINAL 8X10 ST (GAUZE/BANDAGES/DRESSINGS) ×3 IMPLANT
DRSG VAC ATS MED SENSATRAC (GAUZE/BANDAGES/DRESSINGS) ×2 IMPLANT
DURAPREP 26ML APPLICATOR (WOUND CARE) ×1 IMPLANT
ELECT CAUTERY BLADE 6.4 (BLADE) ×3 IMPLANT
ELECT REM PT RETURN 9FT ADLT (ELECTROSURGICAL)
ELECTRODE REM PT RTRN 9FT ADLT (ELECTROSURGICAL) IMPLANT
FACESHIELD WRAPAROUND (MASK) IMPLANT
FACESHIELD WRAPAROUND OR TEAM (MASK) IMPLANT
GAUZE SPONGE 4X4 12PLY STRL (GAUZE/BANDAGES/DRESSINGS) ×2 IMPLANT
GAUZE SPONGE 4X4 12PLY STRL LF (GAUZE/BANDAGES/DRESSINGS) ×2 IMPLANT
GAUZE XEROFORM 1X8 LF (GAUZE/BANDAGES/DRESSINGS) ×1 IMPLANT
GAUZE XEROFORM 5X9 LF (GAUZE/BANDAGES/DRESSINGS) ×1 IMPLANT
GLOVE BIO SURGEON STRL SZ7.5 (GLOVE) ×3 IMPLANT
GLOVE BIOGEL PI IND STRL 8 (GLOVE) ×1 IMPLANT
GLOVE BIOGEL PI INDICATOR 8 (GLOVE) ×2
GOWN STRL REUS W/ TWL LRG LVL3 (GOWN DISPOSABLE) ×2 IMPLANT
GOWN STRL REUS W/ TWL XL LVL3 (GOWN DISPOSABLE) ×1 IMPLANT
GOWN STRL REUS W/TWL LRG LVL3 (GOWN DISPOSABLE) ×6
GOWN STRL REUS W/TWL XL LVL3 (GOWN DISPOSABLE) ×3
HANDPIECE INTERPULSE COAX TIP (DISPOSABLE)
KIT BASIN OR (CUSTOM PROCEDURE TRAY) ×3 IMPLANT
KIT TURNOVER KIT B (KITS) ×3 IMPLANT
MANIFOLD NEPTUNE II (INSTRUMENTS) ×3 IMPLANT
NS IRRIG 1000ML POUR BTL (IV SOLUTION) ×4 IMPLANT
PACK ORTHO EXTREMITY (CUSTOM PROCEDURE TRAY) ×3 IMPLANT
PAD ABD 8X10 STRL (GAUZE/BANDAGES/DRESSINGS) ×4 IMPLANT
PAD ARMBOARD 7.5X6 YLW CONV (MISCELLANEOUS) ×6 IMPLANT
PADDING CAST ABS 4INX4YD NS (CAST SUPPLIES) ×4
PADDING CAST ABS COTTON 4X4 ST (CAST SUPPLIES) ×2 IMPLANT
PADDING CAST COTTON 6X4 STRL (CAST SUPPLIES) ×3 IMPLANT
SET CYSTO W/LG BORE CLAMP LF (SET/KITS/TRAYS/PACK) ×5 IMPLANT
SET HNDPC FAN SPRY TIP SCT (DISPOSABLE) IMPLANT
SPONGE LAP 18X18 RF (DISPOSABLE) ×6 IMPLANT
STOCKINETTE IMPERVIOUS 9X36 MD (GAUZE/BANDAGES/DRESSINGS) ×3 IMPLANT
SUT ETHILON 2 0 FS 18 (SUTURE) IMPLANT
SUT ETHILON 2 0 PSLX (SUTURE) IMPLANT
SUT ETHILON 3 0 PS 1 (SUTURE) IMPLANT
SUT VIC AB 2-0 CT1 36 (SUTURE) IMPLANT
SUT VIC AB 2-0 FS1 27 (SUTURE) IMPLANT
SWAB CULTURE ESWAB REG 1ML (MISCELLANEOUS) IMPLANT
SYR CONTROL 10ML LL (SYRINGE) IMPLANT
TOWEL GREEN STERILE (TOWEL DISPOSABLE) ×3 IMPLANT
TOWEL GREEN STERILE FF (TOWEL DISPOSABLE) ×3 IMPLANT
TUBE CONNECTING 12'X1/4 (SUCTIONS) ×1
TUBE CONNECTING 12X1/4 (SUCTIONS) ×2 IMPLANT
TUBE FEEDING ENTERAL 5FR 16IN (TUBING) IMPLANT
UNDERPAD 30X30 (UNDERPADS AND DIAPERS) ×6 IMPLANT
WATER STERILE IRR 1000ML POUR (IV SOLUTION) ×1 IMPLANT
YANKAUER SUCT BULB TIP NO VENT (SUCTIONS) ×3 IMPLANT

## 2019-11-13 NOTE — Anesthesia Procedure Notes (Signed)
Procedure Name: Intubation Date/Time: 11/13/2019 2:25 PM Performed by: Eligha Bridegroom, CRNA Pre-anesthesia Checklist: Patient identified, Emergency Drugs available, Suction available, Patient being monitored and Timeout performed Patient Re-evaluated:Patient Re-evaluated prior to induction Oxygen Delivery Method: Circle system utilized Preoxygenation: Pre-oxygenation with 100% oxygen Induction Type: IV induction, Rapid sequence and Cricoid Pressure applied Laryngoscope Size: Mac and 4 Grade View: Grade I Tube size: 7.5 mm Number of attempts: 1 Airway Equipment and Method: Stylet Placement Confirmation: ETT inserted through vocal cords under direct vision,  positive ETCO2 and breath sounds checked- equal and bilateral Secured at: 22 cm Tube secured with: Tape Dental Injury: Teeth and Oropharynx as per pre-operative assessment

## 2019-11-13 NOTE — ED Notes (Signed)
Ask pt for UA sample. Pt nod his head up and down.

## 2019-11-13 NOTE — ED Notes (Signed)
Patient transported to Ultrasound 
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
## 2019-11-13 NOTE — ED Notes (Signed)
Ask pt for UA sample pt stated he would try

## 2019-11-13 NOTE — Transfer of Care (Signed)
Immediate Anesthesia Transfer of Care Note  Patient: Adam Lynch  Procedure(s) Performed: IRRIGATION AND DEBRIDEMENT EXTREMITY (Right )  Patient Location: PACU  Anesthesia Type:General  Level of Consciousness: drowsy  Airway & Oxygen Therapy: Patient Spontanous Breathing and Patient connected to nasal cannula oxygen  Post-op Assessment: Report given to RN and Post -op Vital signs reviewed and stable  Post vital signs: Reviewed and stable  Last Vitals:  Vitals Value Taken Time  BP 120/75 11/13/19 1522  Temp    Pulse 98 11/13/19 1529  Resp 20 11/13/19 1529  SpO2 100 % 11/13/19 1529  Vitals shown include unvalidated device data.  Last Pain:  Vitals:   11/13/19 1522  TempSrc:   PainSc: (P) Asleep         Complications: No apparent anesthesia complications

## 2019-11-13 NOTE — Brief Op Note (Signed)
11/13/2019  3:14 PM  PATIENT:  Adam Lynch  63 y.o. male  PRE-OPERATIVE DIAGNOSIS:  infected foot  POST-OPERATIVE DIAGNOSIS:  infected foot  PROCEDURE:  Procedure(s): IRRIGATION AND DEBRIDEMENT EXTREMITY (Right)  SURGEON:  Surgeon(s) and Role:    * Nicholes Stairs, MD - Primary  PHYSICIAN ASSISTANT:   ASSISTANTS: Laure Kidney, RNFA   ANESTHESIA:   general  EBL:  20 cc  BLOOD ADMINISTERED:none  DRAINS: none   LOCAL MEDICATIONS USED:  NONE  SPECIMEN:  No Specimen  DISPOSITION OF SPECIMEN:  N/A  COUNTS:  YES  TOURNIQUET:  * No tourniquets in log *  DICTATION: .Note written in EPIC  PLAN OF CARE: Admit to inpatient   PATIENT DISPOSITION:  PACU - hemodynamically stable.   Delay start of Pharmacological VTE agent (>24hrs) due to surgical blood loss or risk of bleeding: not applicable

## 2019-11-13 NOTE — Progress Notes (Signed)
Pharmacy Antibiotic Note  Adam Lynch is a 63 y.o. male admitted on 11/12/2019 with sepsis.  Pharmacy has been consulted for cefepime and vancomycin dosing. His SrCr is elevated from baseline.    Plan: Vancomycin 2gm IV x 1 then dose based on renal function Cefepime 1gm IV q24 hours after 2gm loading dose F/u renal function, cultures and clinical course  Height: 5\' 8"  (172.7 cm) Weight: 180 lb (81.6 kg) IBW/kg (Calculated) : 68.4  Temp (24hrs), Avg:98.1 F (36.7 C), Min:97.5 F (36.4 C), Max:98.7 F (37.1 C)  Recent Labs  Lab 11/12/19 1619  WBC 9.4  CREATININE 4.76*  LATICACIDVEN 1.6    Estimated Creatinine Clearance: 15.2 mL/min (A) (by C-G formula based on SCr of 4.76 mg/dL (H)).    Allergies  Allergen Reactions  . Morphine And Related Other (See Comments)    Skin crawls - like ants on skin    Thank you for allowing pharmacy to be a part of this patient's care.  Beverlee Nims 11/13/2019 5:36 AM
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
## 2019-11-13 NOTE — Anesthesia Postprocedure Evaluation (Signed)
Anesthesia Post Note  Patient: [facility]  Procedure(s) Performed: IRRIGATION AND DEBRIDEMENT EXTREMITY (Right )     Patient location during evaluation: PACU Anesthesia Type: General Level of consciousness: awake and alert Pain management: pain level controlled Vital Signs Assessment: post-procedure vital signs reviewed and stable Respiratory status: spontaneous breathing, nonlabored ventilation, respiratory function stable and patient connected to nasal cannula oxygen Cardiovascular status: blood pressure returned to baseline and stable Postop Assessment: no apparent nausea or vomiting Anesthetic complications: no    Last Vitals:  Vitals:   11/13/19 1620 11/13/19 1634  BP: (!) 130/58   Pulse: 87 87  Resp: 18 19  Temp: 36.8 C 36.7 C  SpO2: 98% 98%    Last Pain:  Vitals:   11/13/19 1634  TempSrc: Oral  PainSc:                  [facility] DAVID

## 2019-11-13 NOTE — Anesthesia Preprocedure Evaluation (Signed)
Anesthesia Evaluation  Patient identified by MRN, date of birth, ID band Patient awake    Reviewed: Allergy & Precautions, NPO status , Patient's Chart, lab work & pertinent test results  Airway Mallampati: I  TM Distance: >3 FB Neck ROM: Full    Dental   Pulmonary COPD, Current Smoker,    Pulmonary exam normal        Cardiovascular hypertension, Pt. on medications Normal cardiovascular exam     Neuro/Psych    GI/Hepatic   Endo/Other    Renal/GU      Musculoskeletal   Abdominal   Peds  Hematology   Anesthesia Other Findings   Reproductive/Obstetrics                             Anesthesia Physical Anesthesia Plan  ASA: II  Anesthesia Plan: General   Post-op Pain Management:    Induction: Intravenous  PONV Risk Score and Plan: 1 and Ondansetron  Airway Management Planned: LMA  Additional Equipment:   Intra-op Plan:   Post-operative Plan: Extubation in OR  Informed Consent: I have reviewed the patients History and Physical, chart, labs and discussed the procedure including the risks, benefits and alternatives for the proposed anesthesia with the patient or authorized representative who has indicated his/her understanding and acceptance.       Plan Discussed with: CRNA and Surgeon  Anesthesia Plan Comments:         Anesthesia Quick Evaluation
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
## 2019-11-13 NOTE — ED Notes (Signed)
Pt returned to room from Ultrasound.

## 2019-11-14 DIAGNOSIS — T148XXA Other injury of unspecified body region, initial encounter: Secondary | ICD-10-CM

## 2019-11-14 DIAGNOSIS — L089 Local infection of the skin and subcutaneous tissue, unspecified: Secondary | ICD-10-CM

## 2019-11-14 LAB — CBC
HCT: 28.2 % — ABNORMAL LOW (ref 39.0–52.0)
Hemoglobin: 9.2 g/dL — ABNORMAL LOW (ref 13.0–17.0)
MCH: 31.1 pg (ref 26.0–34.0)
MCHC: 32.6 g/dL (ref 30.0–36.0)
MCV: 95.3 fL (ref 80.0–100.0)
Platelets: 376 10*3/uL (ref 150–400)
RBC: 2.96 MIL/uL — ABNORMAL LOW (ref 4.22–5.81)
RDW: 13.3 % (ref 11.5–15.5)
WBC: 7.1 10*3/uL (ref 4.0–10.5)
nRBC: 0 % (ref 0.0–0.2)

## 2019-11-14 LAB — COMPREHENSIVE METABOLIC PANEL
ALT: 16 U/L (ref 0–44)
AST: 23 U/L (ref 15–41)
Albumin: 2.4 g/dL — ABNORMAL LOW (ref 3.5–5.0)
Alkaline Phosphatase: 50 U/L (ref 38–126)
Anion gap: 8 (ref 5–15)
BUN: 53 mg/dL — ABNORMAL HIGH (ref 8–23)
CO2: 23 mmol/L (ref 22–32)
Calcium: 8.6 mg/dL — ABNORMAL LOW (ref 8.9–10.3)
Chloride: 107 mmol/L (ref 98–111)
Creatinine, Ser: 4.48 mg/dL — ABNORMAL HIGH (ref 0.61–1.24)
GFR calc Af Amer: 15 mL/min — ABNORMAL LOW (ref >60)
GFR calc non Af Amer: 13 mL/min — ABNORMAL LOW (ref >60)
Glucose, Bld: 111 mg/dL — ABNORMAL HIGH (ref 70–99)
Potassium: 4.7 mmol/L (ref 3.5–5.1)
Sodium: 138 mmol/L (ref 135–145)
Total Bilirubin: 0.7 mg/dL (ref 0.3–1.2)
Total Protein: 5.6 g/dL — ABNORMAL LOW (ref 6.5–8.1)

## 2019-11-14 LAB — URINE CULTURE: Culture: NO GROWTH

## 2019-11-14 LAB — PREALBUMIN: Prealbumin: 12.8 mg/dL — ABNORMAL LOW (ref 18–38)

## 2019-11-14 LAB — UREA NITROGEN, URINE: Urea Nitrogen, Ur: 429 mg/dL

## 2019-11-14 MED ORDER — PRO-STAT SUGAR FREE PO LIQD
30.0000 mL | Freq: Two times a day (BID) | ORAL | Status: DC
  Administered 2019-11-14 – 2019-11-20 (×10): 30 mL via ORAL
  Filled 2019-11-14 (×12): qty 30

## 2019-11-14 MED ORDER — CLONAZEPAM 0.5 MG PO TABS
1.0000 mg | ORAL_TABLET | Freq: Two times a day (BID) | ORAL | Status: DC
  Administered 2019-11-14 – 2019-11-18 (×9): 1 mg via ORAL
  Filled 2019-11-14 (×9): qty 2

## 2019-11-14 MED ORDER — FLUTICASONE FUROATE-VILANTEROL 200-25 MCG/INH IN AEPB
1.0000 | INHALATION_SPRAY | Freq: Every day | RESPIRATORY_TRACT | Status: DC
  Filled 2019-11-14: qty 28
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
## 2019-11-14 NOTE — Progress Notes (Signed)
Orthopedics Progress Note  Subjective: No complaints this morning. Pain controlled  Objective:  Vitals:   11/13/19 1634 11/13/19 2258  BP:  101/60  Pulse: 87 77  Resp: 19   Temp: 98.1 F (36.7 C) 98.5 F (36.9 C)  SpO2: 98% 96%    General: Awake and alert  Musculoskeletal:Right lower leg/foot dressing intact and wound vac functioning. Compartments supple, Neg  Homans Wiggles toes well, sensation intact Neurovascularly intact  Lab Results  Component Value Date   WBC 7.1 11/14/2019   HGB 9.2 (L) 11/14/2019   HCT 28.2 (L) 11/14/2019   MCV 95.3 11/14/2019   PLT 376 11/14/2019       Component Value Date/Time   NA 138 11/14/2019 0302   K 4.7 11/14/2019 0302   CL 107 11/14/2019 0302   CO2 23 11/14/2019 0302   GLUCOSE 111 (H) 11/14/2019 0302   BUN 53 (H) 11/14/2019 0302   CREATININE 4.48 (H) 11/14/2019 0302   CALCIUM 8.6 (L) 11/14/2019 0302   GFRNONAA 13 (L) 11/14/2019 0302   GFRAA 15 (L) 11/14/2019 0302    Lab Results  Component Value Date   INR 1.2 11/13/2019   INR 1.1 10/11/2019    Assessment/Plan: POD #1 s/p Procedure(s): IRRIGATION AND DEBRIDEMENT EXTREMITY Continue abx per ID/medicine Continue wound vac and leg elevation  Remo Lipps R. Veverly Fells, MD 11/14/2019 6:36 AM

## 2019-11-15 LAB — BASIC METABOLIC PANEL
Anion gap: 8 (ref 5–15)
BUN: 40 mg/dL — ABNORMAL HIGH (ref 8–23)
CO2: 23 mmol/L (ref 22–32)
Calcium: 8.2 mg/dL — ABNORMAL LOW (ref 8.9–10.3)
Chloride: 109 mmol/L (ref 98–111)
Creatinine, Ser: 2.96 mg/dL — ABNORMAL HIGH (ref 0.61–1.24)
GFR calc Af Amer: 25 mL/min — ABNORMAL LOW (ref >60)
GFR calc non Af Amer: 21 mL/min — ABNORMAL LOW (ref >60)
Glucose, Bld: 96 mg/dL (ref 70–99)
Potassium: 4.6 mmol/L (ref 3.5–5.1)
Sodium: 140 mmol/L (ref 135–145)

## 2019-11-15 LAB — CBC
HCT: 27.6 % — ABNORMAL LOW (ref 39.0–52.0)
Hemoglobin: 8.8 g/dL — ABNORMAL LOW (ref 13.0–17.0)
MCH: 30.7 pg (ref 26.0–34.0)
MCHC: 31.9 g/dL (ref 30.0–36.0)
MCV: 96.2 fL (ref 80.0–100.0)
Platelets: 385 10*3/uL (ref 150–400)
RBC: 2.87 MIL/uL — ABNORMAL LOW (ref 4.22–5.81)
RDW: 13.3 % (ref 11.5–15.5)
WBC: 7.3 10*3/uL (ref 4.0–10.5)
nRBC: 0 % (ref 0.0–0.2)
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
## 2019-11-15 NOTE — Progress Notes (Signed)
PT Cancellation Note  Patient Details Name: Adam Lynch MRN: [id number] DOB: Sep 10, 1956   Cancelled Treatment:    Reason Eval/Treat Not Completed: Other (comment)(Pt refusal)  Pt reports frustration with what the plan is regarding his wound and when he may have more surgeries.  He was not interested in any therapy today and stated we could check back with him tomorrow.       Melvern Banker 11/15/2019, 11:43 AM Ophelia Shoulder, PT   Acute Rehabilitation Services  Pager [phone] Office [phone] 11/15/2019

## 2019-11-15 NOTE — Progress Notes (Signed)
Subjective: 2 Days Post-Op Procedure(s) (LRB): IRRIGATION AND DEBRIDEMENT EXTREMITY (Right) Patient reports pain as 2 on 0-10 scale.   Denies CP or SOB.  Voiding without difficulty. Positive flatus. Objective: Vital signs in last 24 hours: Temp:  [98 F (36.7 C)-98.5 F (36.9 C)] 98.2 F (36.8 C) (12/26 0749) Pulse Rate:  [65-69] 65 (12/26 0749) Resp:  [16-23] 16 (12/26 0655) BP: (112-116)/(68-69) 116/69 (12/26 0749) SpO2:  [96 %-98 %] 97 % (12/26 0749) Weight:  [85 kg] 85 kg (12/26 0655)  Intake/Output from previous day: 12/25 0701 - 12/26 0700 In: 1223.2 [P.O.:222; I.V.:807; IV Piggyback:194.2] Out: 9163 [Urine:1490] Intake/Output this shift: No intake/output data recorded.  Labs: Recent Labs    11/12/19 1619 11/14/19 0302 11/15/19 0450  HGB 11.6* 9.2* 8.8*   Recent Labs    11/14/19 0302 11/15/19 0450  WBC 7.1 7.3  RBC 2.96* 2.87*  HCT 28.2* 27.6*  PLT 376 385   Recent Labs    11/14/19 0302 11/15/19 0450  NA 138 140  K 4.7 4.6  CL 107 109  CO2 23 23  BUN 53* 40*  CREATININE 4.48* 2.96*  GLUCOSE 111* 96  CALCIUM 8.6* 8.2*   Recent Labs    11/13/19 0512  INR 1.2    Physical Exam: ABD soft Intact pulses distally Dorsiflexion/Plantar flexion intact Incision: dressing C/D/I Compartment soft Body mass index is 28.49 kg/m.   Assessment/Plan: 2 Days Post-Op Procedure(s) (LRB): IRRIGATION AND DEBRIDEMENT EXTREMITY (Right) Patient stable Plan on possible second surgery next week with Dr Sharol Given Continue current regimen  Dahlia Bailiff for Dr. Melina Schools [facility] [phone] 11/15/2019, 8:50 AM

## 2019-11-16 LAB — BASIC METABOLIC PANEL
Anion gap: 9 (ref 5–15)
BUN: 32 mg/dL — ABNORMAL HIGH (ref 8–23)
CO2: 22 mmol/L (ref 22–32)
Calcium: 8.7 mg/dL — ABNORMAL LOW (ref 8.9–10.3)
Chloride: 112 mmol/L — ABNORMAL HIGH (ref 98–111)
Creatinine, Ser: 2.25 mg/dL — ABNORMAL HIGH (ref 0.61–1.24)
GFR calc Af Amer: 34 mL/min — ABNORMAL LOW (ref >60)
GFR calc non Af Amer: 30 mL/min — ABNORMAL LOW (ref >60)
Glucose, Bld: 100 mg/dL — ABNORMAL HIGH (ref 70–99)
Potassium: 4.2 mmol/L (ref 3.5–5.1)
Sodium: 143 mmol/L (ref 135–145)

## 2019-11-16 LAB — AEROBIC CULTURE W GRAM STAIN (SUPERFICIAL SPECIMEN): Gram Stain: NONE SEEN

## 2019-11-16 LAB — VANCOMYCIN, RANDOM: Vancomycin Rm: 7

## 2019-11-16 MED ORDER — ASPIRIN EC 325 MG PO TBEC: 325.0000 mg | DELAYED_RELEASE_TABLET | Freq: Every day | ORAL | 3 refills | Status: AC

## 2019-11-16 MED ORDER — VANCOMYCIN HCL 1500 MG/300ML IV SOLN
1500.0000 mg | Freq: Once | INTRAVENOUS | Status: AC
  Administered 2019-11-16: 1500 mg via INTRAVENOUS
  Filled 2019-11-16: qty 300

## 2019-11-16 MED ORDER — SODIUM CHLORIDE 0.9 % IV SOLN
2.0000 g | Freq: Two times a day (BID) | INTRAVENOUS | Status: AC
  Administered 2019-11-16 – 2019-11-20 (×8): 2 g via INTRAVENOUS
  Filled 2019-11-16 (×9): qty 2

## 2019-11-16 MED ORDER — CIPROFLOXACIN HCL 500 MG PO TABS: 500.0000 mg | ORAL_TABLET | Freq: Two times a day (BID) | ORAL | 0 refills | Status: AC

## 2019-11-16 MED ORDER — PRO-STAT SUGAR FREE PO LIQD: 30.0000 mL | Freq: Two times a day (BID) | ORAL | 0 refills | Status: AC

## 2019-11-16 NOTE — Progress Notes (Signed)
Subjective: 3 Days Post-Op Procedure(s) (LRB): IRRIGATION AND DEBRIDEMENT EXTREMITY (Right) Patient reports pain as mild.    Objective: Vital signs in last 24 hours: Temp:  [98 F (36.7 C)-98.4 F (36.9 C)] 98.2 F (36.8 C) (12/27 0700) Pulse Rate:  [67-74] 67 (12/27 0700) Resp:  [12] 12 (12/27 0700) BP: (105-136)/(54-75) 123/63 (12/27 0700) SpO2:  [96 %-98 %] 98 % (12/27 0700) Weight:  [93.5 kg] 93.5 kg (12/27 0700)  Intake/Output from previous day: 12/26 0701 - 12/27 0700 In: -  Out: 1900 [Urine:1900] Intake/Output this shift: Total I/O In: -  Out: 600 [Urine:600]  Recent Labs    11/14/19 0302 11/15/19 0450  HGB 9.2* 8.8*   Recent Labs    11/14/19 0302 11/15/19 0450  WBC 7.1 7.3  RBC 2.96* 2.87*  HCT 28.2* 27.6*  PLT 376 385   Recent Labs    11/15/19 0450 11/16/19 0333  NA 140 143  K 4.6 4.2  CL 109 112*  CO2 23 22  BUN 40* 32*  CREATININE 2.96* 2.25*  GLUCOSE 96 100*  CALCIUM 8.2* 8.7*   No results for input(s): LABPT, INR in the last 72 hours.  ABD soft Neurovascular intact Sensation intact distally Incision: dressing C/D/I Wound Vac still pulling out seriosanguanous fluid at this time Good Cap refill   Assessment/Plan: 3 Days Post-Op Procedure(s) (LRB): IRRIGATION AND DEBRIDEMENT EXTREMITY (Right) Continue abx per ID/ medicine Continue Wound vac and leg elevation.   Drue Novel, PA-C Orthopedic Surgery [phone] 11/16/2019, 9:02 AM

## 2019-11-16 NOTE — Progress Notes (Signed)
Pharmacy Antibiotic Note  Adam Lynch is a 63 y.o. male admitted on 11/12/2019 with sepsis.  Pharmacy has been consulted for cefepime and vancomycin dosing. His SrCr is elevated from baseline but has trended down to 2.25.  Random vanc level 7 mcg/ml this am  Plan: Vancomycin 1500 mg IV x 1 then dose based on renal function Cefepime 1gm IV q24 F/u renal function, cultures and clinical course  Height: 5\' 8"  (172.7 cm) Weight: 187 lb 6.3 oz (85 kg) IBW/kg (Calculated) : 68.4  Temp (24hrs), Avg:98.1 F (36.7 C), Min:98 F (36.7 C), Max:98.4 F (36.9 C)  Recent Labs  Lab 11/12/19 1619 11/13/19 0512 11/13/19 0923 11/14/19 0302 11/15/19 0450 11/16/19 0333  WBC 9.4  --   --  7.1 7.3  --   CREATININE 4.76*  --   --  4.48* 2.96* 2.25*  LATICACIDVEN 1.6 1.6 0.9  --   --   --   VANCORANDOM  --   --   --   --   --  7    Estimated Creatinine Clearance: 35.2 mL/min (A) (by C-G formula based on SCr of 2.25 mg/dL (H)).    Allergies  Allergen Reactions  . Morphine And Related Other (See Comments)    Skin crawls - like ants on skin    Thank you for allowing pharmacy to be a part of this patient's care.  Excell Seltzer Poteet 11/16/2019 5:08 AM
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
## 2019-11-17 LAB — BASIC METABOLIC PANEL
Anion gap: 9 (ref 5–15)
BUN: 26 mg/dL — ABNORMAL HIGH (ref 8–23)
CO2: 22 mmol/L (ref 22–32)
Calcium: 8.7 mg/dL — ABNORMAL LOW (ref 8.9–10.3)
Chloride: 112 mmol/L — ABNORMAL HIGH (ref 98–111)
Creatinine, Ser: 1.87 mg/dL — ABNORMAL HIGH (ref 0.61–1.24)
GFR calc Af Amer: 43 mL/min — ABNORMAL LOW (ref >60)
GFR calc non Af Amer: 37 mL/min — ABNORMAL LOW (ref >60)
Glucose, Bld: 103 mg/dL — ABNORMAL HIGH (ref 70–99)
Potassium: 4.3 mmol/L (ref 3.5–5.1)
Sodium: 143 mmol/L (ref 135–145)

## 2019-11-17 NOTE — Care Management Important Message (Signed)
Important Message  Patient Details  Name: TREMONT GAVITT MRN: [id number] Date of Birth: Sep 07, 1956   Medicare Important Message Given:  Yes     [name] 11/17/2019, 1:03 PM
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
## 2019-11-17 NOTE — TOC Initial Note (Signed)
Transition of Care [facility]) - Initial/Assessment Note    Patient Details  Name: Adam Lynch MRN: [id number] Date of Birth: 11-Apr-1956  Transition of Care [facility]) CM/SW Contact:    Lawerance Sabal, RN Phone Number: 11/17/2019, 11:13 AM  Clinical Narrative:                 Spoke w patient at bedside. He states that he lives at home w his wife who has Alzheimer's. He states that his home is fully equippped for handicapped use. He has all needed DME at home. Discussed HH providers and medicare ratings. He would like to use [facility] at [place]. He will need HH orders and referral placed closer to [place]. He has a VAC at this time, I spoke w PA and patient to possibly return to OR later this week, and it is uncertain at this time if he will need a VAC for home, I did not start the process the process for getting one for home, but this will need to be followed.    Expected Discharge Plan: Home w Home Health Services Barriers to Discharge: Continued Medical Work up   Patient Goals and CMS Choice Patient states their goals for this hospitalization and ongoing recovery are:: to go home CMS Medicare.gov Compare Post Acute Care list provided to:: Patient Choice offered to / list presented to : Patient  Expected Discharge Plan and Services Expected Discharge Plan: Home w Home Health Services     Post Acute Care Choice: Home Health, Durable Medical Equipment Living arrangements for the past 2 months: Single Family Home Expected Discharge Date: 11/16/19                                    Prior Living Arrangements/Services Living arrangements for the past 2 months: Single Family Home Lives with:: Spouse                   Activities of Daily Living   ADL Screening (condition at time of admission) Patient's cognitive ability adequate to safely complete daily activities?: Yes Is the patient deaf or have difficulty hearing?: No Does the patient have difficulty seeing, even when wearing  glasses/contacts?: No Does the patient have difficulty concentrating, remembering, or making decisions?: No Patient able to express need for assistance with ADLs?: Yes Does the patient have difficulty dressing or bathing?: Yes Independently performs ADLs?: No Communication: Independent Dressing (OT): Needs assistance Is this a change from baseline?: Change from baseline, expected to last >3 days Grooming: Needs assistance Is this a change from baseline?: Change from baseline, expected to last >3 days Feeding: Independent Bathing: Needs assistance Is this a change from baseline?: Change from baseline, expected to last >3 days Toileting: Needs assistance Is this a change from baseline?: Change from baseline, expected to last >3days In/Out Bed: Needs assistance Is this a change from baseline?: Change from baseline, expected to last >3 days Does the patient have difficulty walking or climbing stairs?: Yes Weakness of Legs: Right Weakness of Arms/Hands: None  Permission Sought/Granted                  Emotional Assessment              Admission diagnosis:  ARF (acute renal failure) (HCC) [N17.9] Wound infection [T14.8XXA, L08.9] Sepsis (HCC) [A41.9] Sepsis, due to unspecified organism, unspecified whether acute organ dysfunction present [facility]) [A41.9] Patient Active Problem List  Diagnosis Date Noted  . Sepsis ([place]) 11/13/2019  . Postoperative wound infection 11/13/2019  . Transient hypotension 11/13/2019  . COPD without exacerbation ([place]) 11/13/2019  . Normocytic anemia 11/13/2019  . Hypoalbuminemia 11/13/2019  . Displaced avulsion fracture of tuberosity of right calcaneus, initial encounter for open fracture 10/12/2019  . Fracture of calcaneus, right, open 10/12/2019   PCP:  Arlyss Repress, MD Pharmacy:   [facility], [place] [address] Phone: [phone] Fax: [phone]     Social Determinants  of Health (SDOH) Interventions    Readmission Risk Interventions No flowsheet data found.
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
## 2019-11-18 ENCOUNTER — Other Ambulatory Visit: Payer: Self-pay | Admitting: Physician Assistant

## 2019-11-18 DIAGNOSIS — M86171 Other acute osteomyelitis, right ankle and foot: Secondary | ICD-10-CM

## 2019-11-18 DIAGNOSIS — E43 Unspecified severe protein-calorie malnutrition: Secondary | ICD-10-CM

## 2019-11-18 LAB — BASIC METABOLIC PANEL
Anion gap: 8 (ref 5–15)
BUN: 26 mg/dL — ABNORMAL HIGH (ref 8–23)
CO2: 24 mmol/L (ref 22–32)
Calcium: 8.7 mg/dL — ABNORMAL LOW (ref 8.9–10.3)
Chloride: 114 mmol/L — ABNORMAL HIGH (ref 98–111)
Creatinine, Ser: 1.84 mg/dL — ABNORMAL HIGH (ref 0.61–1.24)
GFR calc Af Amer: 44 mL/min — ABNORMAL LOW (ref >60)
GFR calc non Af Amer: 38 mL/min — ABNORMAL LOW (ref >60)
Glucose, Bld: 93 mg/dL (ref 70–99)
Potassium: 4.3 mmol/L (ref 3.5–5.1)
Sodium: 146 mmol/L — ABNORMAL HIGH (ref 135–145)

## 2019-11-18 LAB — CULTURE, BLOOD (ROUTINE X 2)
Culture: NO GROWTH
Culture: NO GROWTH

## 2019-11-18 LAB — SURGICAL PCR SCREEN
MRSA, PCR: NEGATIVE
Staphylococcus aureus: POSITIVE — AB

## 2019-11-18 MED ORDER — POLYETHYLENE GLYCOL 3350 17 G PO PACK
17.0000 g | PACK | Freq: Every day | ORAL | Status: DC
  Administered 2019-11-18: 17 g via ORAL
  Filled 2019-11-18 (×3): qty 1

## 2019-11-18 MED ORDER — CLONAZEPAM 0.5 MG PO TABS: 1.5000 mg | ORAL_TABLET | Freq: Every day | ORAL | Status: DC

## 2019-11-18 MED ORDER — CLONAZEPAM 0.5 MG PO TABS
0.5000 mg | ORAL_TABLET | Freq: Once | ORAL | Status: DC
  Filled 2019-11-18: qty 1

## 2019-11-18 MED ORDER — CHLORHEXIDINE GLUCONATE 4 % EX LIQD: 60.0000 mL | Freq: Once | CUTANEOUS | Status: DC

## 2019-11-18 MED ORDER — CLONAZEPAM 0.5 MG PO TABS
1.5000 mg | ORAL_TABLET | ORAL | Status: DC
  Administered 2019-11-18 – 2019-11-19 (×2): 1.5 mg via ORAL
  Filled 2019-11-18 (×2): qty 3

## 2019-11-18 MED ORDER — MUPIROCIN 2 % EX OINT
1.0000 "application " | TOPICAL_OINTMENT | Freq: Two times a day (BID) | CUTANEOUS | Status: DC
  Administered 2019-11-18 – 2019-11-20 (×4): 1 via NASAL
  Filled 2019-11-18 (×2): qty 22

## 2019-11-18 MED ORDER — CLONAZEPAM 0.5 MG PO TABS
1.0000 mg | ORAL_TABLET | ORAL | Status: DC
  Administered 2019-11-18: 1 mg via ORAL
  Filled 2019-11-18: qty 2

## 2019-11-18 MED ORDER — CLONAZEPAM 0.5 MG PO TABS: 1.0000 mg | ORAL_TABLET | Freq: Every day | ORAL | Status: DC

## 2019-11-18 MED ORDER — CEFAZOLIN SODIUM-DEXTROSE 2-4 GM/100ML-% IV SOLN
2.0000 g | INTRAVENOUS | Status: DC
  Filled 2019-11-18 (×2): qty 100

## 2019-11-18 MED ORDER — CLONAZEPAM 0.5 MG PO TABS
1.0000 mg | ORAL_TABLET | Freq: Every day | ORAL | Status: DC
  Administered 2019-11-19 – 2019-11-20 (×2): 1 mg via ORAL
  Filled 2019-11-18 (×2): qty 2
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
## 2019-11-18 NOTE — Progress Notes (Signed)
PT Cancellation Note  Patient Details Name: Adam Lynch MRN: [id number] DOB: [birth year]   Cancelled Treatment:    Reason Eval/Treat Not Completed: Other (comment). Pt fatigued and anxious about surgery tomorrow. Will continue to follow.   Shary Decamp Maycok 11/18/2019, [place] Pager [phone] Office [phone]

## 2019-11-18 NOTE — Progress Notes (Signed)
Pt had burst of SVT around 21:00.
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
## 2019-11-19 ENCOUNTER — Encounter (HOSPITAL_COMMUNITY)
Admission: EM | Disposition: A | Discharge status: Home Health | Payer: Self-pay | Source: Home / Self Care | Attending: Internal Medicine

## 2019-11-19 ENCOUNTER — Inpatient Hospital Stay (HOSPITAL_COMMUNITY): Payer: Medicare HMO | Admitting: Anesthesiology

## 2019-11-19 ENCOUNTER — Encounter (HOSPITAL_COMMUNITY): Payer: Self-pay | Admitting: Internal Medicine

## 2019-11-19 DIAGNOSIS — M86171 Other acute osteomyelitis, right ankle and foot: Secondary | ICD-10-CM

## 2019-11-19 HISTORY — PX: AMPUTATION: SHX166

## 2019-11-19 SURGERY — AMPUTATION BELOW KNEE
Anesthesia: General | Site: Knee | Laterality: Right

## 2019-11-19 MED ORDER — FENTANYL CITRATE (PF) 100 MCG/2ML IJ SOLN
INTRAMUSCULAR | Status: AC
  Administered 2019-11-19: 50 ug via INTRAVENOUS
  Filled 2019-11-19: qty 2

## 2019-11-19 MED ORDER — FENTANYL CITRATE (PF) 100 MCG/2ML IJ SOLN: 50.0000 ug | Freq: Once | INTRAMUSCULAR | Status: AC

## 2019-11-19 MED ORDER — MIDAZOLAM HCL 2 MG/2ML IJ SOLN
INTRAMUSCULAR | Status: AC
  Administered 2019-11-19: 1 mg via INTRAVENOUS
  Filled 2019-11-19: qty 2

## 2019-11-19 MED ORDER — ACETAMINOPHEN 500 MG PO TABS
1000.0000 mg | ORAL_TABLET | Freq: Once | ORAL | Status: AC
  Administered 2019-11-19: 1000 mg via ORAL
  Filled 2019-11-19: qty 2

## 2019-11-19 MED ORDER — FENTANYL CITRATE (PF) 250 MCG/5ML IJ SOLN
INTRAMUSCULAR | Status: AC
  Filled 2019-11-19: qty 5

## 2019-11-19 MED ORDER — DEXAMETHASONE SODIUM PHOSPHATE 4 MG/ML IJ SOLN
INTRAMUSCULAR | Status: DC | PRN
  Administered 2019-11-19: 10 mg via INTRAVENOUS

## 2019-11-19 MED ORDER — MIDAZOLAM HCL 2 MG/2ML IJ SOLN: 1.0000 mg | Freq: Once | INTRAMUSCULAR | Status: AC

## 2019-11-19 MED ORDER — LACTATED RINGERS IV SOLN: INTRAVENOUS | Status: DC

## 2019-11-19 MED ORDER — 0.9 % SODIUM CHLORIDE (POUR BTL) OPTIME
TOPICAL | Status: DC | PRN
  Administered 2019-11-19: 1000 mL

## 2019-11-19 MED ORDER — PROPOFOL 10 MG/ML IV BOLUS
INTRAVENOUS | Status: DC | PRN
  Administered 2019-11-19: 180 mg via INTRAVENOUS

## 2019-11-19 MED ORDER — BUPIVACAINE HCL (PF) 0.25 % IJ SOLN
INTRAMUSCULAR | Status: DC | PRN
  Administered 2019-11-19: 15 mL
  Administered 2019-11-19: 10 mL

## 2019-11-19 MED ORDER — BUPIVACAINE LIPOSOME 1.3 % IJ SUSP
INTRAMUSCULAR | Status: DC | PRN
  Administered 2019-11-19 (×2): 5 mL via PERINEURAL

## 2019-11-19 MED ORDER — EPHEDRINE SULFATE 50 MG/ML IJ SOLN
INTRAMUSCULAR | Status: DC | PRN
  Administered 2019-11-19 (×2): 5 mg via INTRAVENOUS

## 2019-11-19 MED ORDER — BUPIVACAINE LIPOSOME 1.3 % IJ SUSP
10.0000 mL | Freq: Once | INTRAMUSCULAR | Status: DC
  Filled 2019-11-19: qty 10

## 2019-11-19 MED ORDER — CHLORHEXIDINE GLUCONATE CLOTH 2 % EX PADS
6.0000 | MEDICATED_PAD | Freq: Every day | CUTANEOUS | Status: DC
  Administered 2019-11-20: 6 via TOPICAL

## 2019-11-19 MED ORDER — ONDANSETRON HCL 4 MG/2ML IJ SOLN
INTRAMUSCULAR | Status: DC | PRN
  Administered 2019-11-19: 4 mg via INTRAVENOUS

## 2019-11-19 MED ORDER — FENTANYL CITRATE (PF) 100 MCG/2ML IJ SOLN
INTRAMUSCULAR | Status: DC | PRN
  Administered 2019-11-19 (×2): 50 ug via INTRAVENOUS

## 2019-11-19 MED ORDER — LIDOCAINE HCL (CARDIAC) PF 100 MG/5ML IV SOSY
PREFILLED_SYRINGE | INTRAVENOUS | Status: DC | PRN
  Administered 2019-11-19: 40 mg via INTRAVENOUS
  Administered 2019-11-19: 60 mg via INTRAVENOUS

## 2019-11-19 SURGICAL SUPPLY — 38 items
BLADE SAW RECIP 87.9 MT (BLADE) ×3 IMPLANT
BLADE SURG 21 STRL SS (BLADE) ×3 IMPLANT
BNDG COHESIVE 6X5 TAN STRL LF (GAUZE/BANDAGES/DRESSINGS) IMPLANT
CANISTER WOUND CARE 500ML ATS (WOUND CARE) ×3 IMPLANT
COVER SURGICAL LIGHT HANDLE (MISCELLANEOUS) ×3 IMPLANT
COVER WAND RF STERILE (DRAPES) IMPLANT
CUFF TOURN SGL QUICK 34 (TOURNIQUET CUFF) ×3
CUFF TRNQT CYL 34X4.125X (TOURNIQUET CUFF) ×1 IMPLANT
DRAPE INCISE IOBAN 66X45 STRL (DRAPES) ×3 IMPLANT
DRAPE U-SHAPE 47X51 STRL (DRAPES) ×3 IMPLANT
DRESSING PREVENA PLUS CUSTOM (GAUZE/BANDAGES/DRESSINGS) ×1 IMPLANT
DRSG PREVENA PLUS CUSTOM (GAUZE/BANDAGES/DRESSINGS) ×3
DURAPREP 26ML APPLICATOR (WOUND CARE) ×3 IMPLANT
ELECT REM PT RETURN 9FT ADLT (ELECTROSURGICAL) ×3
ELECTRODE REM PT RTRN 9FT ADLT (ELECTROSURGICAL) ×1 IMPLANT
GLOVE BIOGEL PI IND STRL 9 (GLOVE) ×1 IMPLANT
GLOVE BIOGEL PI INDICATOR 9 (GLOVE) ×2
GLOVE SURG ORTHO 9.0 STRL STRW (GLOVE) ×3 IMPLANT
GOWN STRL REUS W/ TWL XL LVL3 (GOWN DISPOSABLE) ×2 IMPLANT
GOWN STRL REUS W/TWL XL LVL3 (GOWN DISPOSABLE) ×6
KIT BASIN OR (CUSTOM PROCEDURE TRAY) ×3 IMPLANT
KIT TURNOVER KIT B (KITS) ×3 IMPLANT
MANIFOLD NEPTUNE II (INSTRUMENTS) ×3 IMPLANT
NS IRRIG 1000ML POUR BTL (IV SOLUTION) ×3 IMPLANT
PACK ORTHO EXTREMITY (CUSTOM PROCEDURE TRAY) ×3 IMPLANT
PAD ARMBOARD 7.5X6 YLW CONV (MISCELLANEOUS) ×3 IMPLANT
PREVENA RESTOR ARTHOFORM 46X30 (CANNISTER) ×3 IMPLANT
SPONGE LAP 18X18 RF (DISPOSABLE) IMPLANT
STAPLER VISISTAT 35W (STAPLE) ×2 IMPLANT
STOCKINETTE IMPERVIOUS LG (DRAPES) ×3 IMPLANT
SUT ETHILON 2 0 PSLX (SUTURE) IMPLANT
SUT SILK 2 0 (SUTURE)
SUT SILK 2-0 18XBRD TIE 12 (SUTURE) ×1 IMPLANT
SUT VIC AB 1 CTX 27 (SUTURE) ×2 IMPLANT
TOWEL GREEN STERILE (TOWEL DISPOSABLE) ×3 IMPLANT
TUBE CONNECTING 12'X1/4 (SUCTIONS) ×1
TUBE CONNECTING 12X1/4 (SUCTIONS) ×2 IMPLANT
YANKAUER SUCT BULB TIP NO VENT (SUCTIONS) ×3 IMPLANT
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
## 2019-11-19 NOTE — Interval H&P Note (Signed)
History and Physical Interval Note:  11/19/2019 6:45 AM  Adam Lynch  has presented today for surgery, with the diagnosis of Gangrene Right Foot.  The various methods of treatment have been discussed with the patient and family. After consideration of risks, benefits and other options for treatment, the patient has consented to  Procedure(s): RIGHT BELOW KNEE AMPUTATION (Right) as a surgical intervention.  The patient's history has been reviewed, patient examined, no change in status, stable for surgery.  I have reviewed the patient's chart and labs.  Questions were answered to the patient's satisfaction.     Newt Minion

## 2019-11-19 NOTE — Progress Notes (Signed)
Orthopedic Tech Progress Note Patient Details:  Adam Lynch November 02, 1956 [id number] Called in order to [facility] for a VIVE PROTOCOL BK Patient ID: Adam Lynch, male   DOB: 1956/11/09, 63 y.o.   MRN: [id number]   Adam Lynch 11/19/2019, 6:27 PM

## 2019-11-19 NOTE — Progress Notes (Signed)
Pharmacy Antibiotic Note  Adam Lynch is a 63 y.o. male admitted on 11/12/2019 with sepsis.  Pharmacy has been consulted for cefepime dosing. His SrCr is elevated from baseline but has trended down to 2.25.    Currently on day #7 of antibiotics. Scr on last check stable at 1.84. Afebrile. WBC on last check 12/26 WNL. Plan for R BKA today with Dr Sharol Given. Wound cx from 12/24 growing proteus mirabilis (pan-sen) and serratia marcescens (R cefazolin). Will f/u for LOT after procedure.   Plan: Cefepime 2gm IV q12 F/u renal function, cultures and clinical course  Height: 5\' 8"  (172.7 cm) Weight: 197 lb 12 oz (89.7 kg) IBW/kg (Calculated) : 68.4  Temp (24hrs), Avg:98.2 F (36.8 C), Min:97.9 F (36.6 C), Max:98.6 F (37 C)  Recent Labs  Lab 11/12/19 1619 11/13/19 0512 11/13/19 0923 11/14/19 0302 11/15/19 0450 11/16/19 0333 11/17/19 0840 11/18/19 0351  WBC 9.4  --   --  7.1 7.3  --   --   --   CREATININE 4.76*  --   --  4.48* 2.96* 2.25* 1.87* 1.84*  LATICACIDVEN 1.6 1.6 0.9  --   --   --   --   --   VANCORANDOM  --   --   --   --   --  7  --   --     Estimated Creatinine Clearance: 44.1 mL/min (A) (by C-G formula based on SCr of 1.84 mg/dL (H)).    Allergies  Allergen Reactions  . Morphine And Related Other (See Comments)    Skin crawls - like ants on skin    Thank you for allowing pharmacy to be a part of this patient's care.  Antonietta Jewel, PharmD, BCCCP Clinical Pharmacist  Phone: [phone]  Please check AMION for all [place] phone numbers After 10:00 PM, call [place] [phone] 11/19/2019 11:49 AM
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
## 2019-11-19 NOTE — Anesthesia Procedure Notes (Signed)
Anesthesia Regional Block: Popliteal block   Pre-Anesthetic Checklist: ,, timeout performed, Correct Patient, Correct Site, Correct Laterality, Correct Procedure, Correct Position, site marked, Risks and benefits discussed, pre-op evaluation,  At surgeon's request and post-op pain management  Laterality: Right  Prep: Maximum Sterile Barrier Precautions used, chloraprep       Needles:  Injection technique: Single-shot  Needle Type: Echogenic Stimulator Needle     Needle Length: 9cm  Needle Gauge: 22     Additional Needles:   Procedures:,,,, ultrasound used (permanent image in chart),,,,  Narrative:  Start time: 11/19/2019 1:44 PM End time: 11/19/2019 1:46 PM Injection made incrementally with aspirations every 5 mL.  Performed by: Personally  Anesthesiologist: Brennan Bailey, MD  Additional Notes: Risks, benefits, and alternative discussed. Patient gave consent for procedure. Patient prepped and draped in sterile fashion. Sedation administered, patient remains easily responsive to voice. Relevant anatomy identified with ultrasound guidance. Local anesthetic given in 5cc increments with no signs or symptoms of intravascular injection. No pain or paraesthesias with injection. Patient monitored throughout procedure with signs of LAST or immediate complications. Tolerated well. Ultrasound image placed in chart.  Tawny Asal, MD
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
## 2019-11-19 NOTE — Anesthesia Preprocedure Evaluation (Addendum)
Anesthesia Evaluation  Patient identified by MRN, date of birth, ID band Patient awake    Reviewed: Allergy & Precautions, NPO status , Patient's Chart, lab work & pertinent test results  History of Anesthesia Complications Negative for: history of anesthetic complications  Airway Mallampati: II  TM Distance: >3 FB Neck ROM: Full    Dental  (+) Poor Dentition   Pulmonary COPD, Current Smoker,    Pulmonary exam normal        Cardiovascular hypertension, Normal cardiovascular exam     Neuro/Psych negative neurological ROS  negative psych ROS   GI/Hepatic negative GI ROS, Neg liver ROS,   Endo/Other  negative endocrine ROS  Renal/GU negative Renal ROS  negative genitourinary   Musculoskeletal  (+) Arthritis , Right calcaneal osteomyelitis   Abdominal   Peds  Hematology  (+) anemia , Hgb 8.8   Anesthesia Other Findings Day of surgery medications reviewed with patient.  Reproductive/Obstetrics negative OB ROS                            Anesthesia Physical Anesthesia Plan  ASA: III  Anesthesia Plan: General   Post-op Pain Management: GA combined w/ Regional for post-op pain   Induction: Intravenous  PONV Risk Score and Plan: 2 and Treatment may vary due to age or medical condition, Ondansetron, Dexamethasone and Midazolam  Airway Management Planned: LMA  Additional Equipment: None  Intra-op Plan:   Post-operative Plan: Extubation in OR  Informed Consent: I have reviewed the patients History and Physical, chart, labs and discussed the procedure including the risks, benefits and alternatives for the proposed anesthesia with the patient or authorized representative who has indicated his/her understanding and acceptance.     Dental advisory given  Plan Discussed with: CRNA  Anesthesia Plan Comments:        Anesthesia Quick Evaluation

## 2019-11-19 NOTE — Transfer of Care (Signed)
Immediate Anesthesia Transfer of Care Note  Patient: Adam Lynch  Procedure(s) Performed: RIGHT BELOW KNEE AMPUTATION (Right Knee)  Patient Location: PACU  Anesthesia Type:General  Level of Consciousness: awake, alert  and oriented  Airway & Oxygen Therapy: Patient Spontanous Breathing  Post-op Assessment: Report given to RN and Post -op Vital signs reviewed and stable  Post vital signs: Reviewed and stable  Last Vitals:  Vitals Value Taken Time  BP 142/78 11/19/19 1529  Temp    Pulse 72 11/19/19 1530  Resp 12 11/19/19 1530  SpO2 97 % 11/19/19 1530  Vitals shown include unvalidated device data.  Last Pain:  Vitals:   11/19/19 0931  TempSrc:   PainSc: 4       Patients Stated Pain Goal: 0 (35/68/61 6837)  Complications: No apparent anesthesia complications

## 2019-11-19 NOTE — Anesthesia Procedure Notes (Signed)
Procedure Name: LMA Insertion Date/Time: 11/19/2019 2:42 PM Performed by: Griffin Dakin, CRNA Pre-anesthesia Checklist: Patient identified, Emergency Drugs available, Suction available and Patient being monitored Patient Re-evaluated:Patient Re-evaluated prior to induction Oxygen Delivery Method: Circle system utilized Preoxygenation: Pre-oxygenation with 100% oxygen Induction Type: IV induction LMA: LMA inserted LMA Size: 5.0 Tube type: Oral Number of attempts: 1 Placement Confirmation: positive ETCO2 and breath sounds checked- equal and bilateral Tube secured with: Tape Dental Injury: Teeth and Oropharynx as per pre-operative assessment

## 2019-11-19 NOTE — Anesthesia Procedure Notes (Signed)
Anesthesia Regional Block: Adductor canal block   Pre-Anesthetic Checklist: ,, timeout performed, Correct Patient, Correct Site, Correct Laterality, Correct Procedure, Correct Position, site marked, Risks and benefits discussed, pre-op evaluation,  At surgeon's request and post-op pain management  Laterality: Right  Prep: Maximum Sterile Barrier Precautions used, chloraprep       Needles:  Injection technique: Single-shot  Needle Type: Echogenic Stimulator Needle     Needle Length: 9cm  Needle Gauge: 22     Additional Needles:   Procedures:,,,, ultrasound used (permanent image in chart),,,,  Narrative:  Start time: 11/19/2019 1:41 PM End time: 11/19/2019 1:43 PM Injection made incrementally with aspirations every 5 mL.  Performed by: Personally  Anesthesiologist: Brennan Bailey, MD  Additional Notes: Risks, benefits, and alternative discussed. Patient gave consent for procedure. Patient prepped and draped in sterile fashion. Sedation administered, patient remains easily responsive to voice. Relevant anatomy identified with ultrasound guidance. Local anesthetic given in 5cc increments with no signs or symptoms of intravascular injection. No pain or paraesthesias with injection. Patient monitored throughout procedure with signs of LAST or immediate complications. Tolerated well. Ultrasound image placed in chart.  Tawny Asal, MD

## 2019-11-20 ENCOUNTER — Ambulatory Visit: Payer: Medicare HMO | Admitting: Orthopedic Surgery

## 2019-11-20 NOTE — Progress Notes (Signed)
Pt discharged with Prevena Plus wound vac. Went over instructions on how to remove cannister, charge battery, and connected pt to wound vac prior to discharge.
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
## 2019-11-20 NOTE — TOC Transition Note (Signed)
Transition of Care [facility]) - CM/SW Discharge Note   Patient Details  Name: Adam Lynch MRN: [id number] Date of Birth: Jun 10, 1956  [facility] CM/SW Contact:  Bethena Roys, RN Phone Number: 11/20/2019, 12:04 PM   Clinical Narrative: Case Manager received confirmation from patient that he has two rolling walkers, a bedside commode and a wheel chair in the home. Patient is agreeable to [facility]- referral called to [place] and Start of Care to begin within 48-72 hours post transition home.   Final next level of care: [place] Barriers to Discharge: No Barriers Identified   Patient Goals and CMS Choice Patient states their goals for this hospitalization and ongoing recovery are:: to go home CMS Medicare.gov Compare Post Acute Care list provided to:: Patient Choice offered to / list presented to : Patient   Discharge Plan and Services   Discharge Planning Services: CM Consult Post Acute Care Choice: Home Health, Durable Medical Equipment            HH Arranged: PT, OT [facility] Agency: [place] Date [place]: 11/20/19 Time [place]: [id number] Representative spoke with at [place]: Tommi Rumps   Readmission Risk Interventions No flowsheet data found.

## 2019-11-20 NOTE — Progress Notes (Signed)
POD 1 R BKA. Doing well . Would like to go home today  Vac working [employer]. [employer] present  Fitting limb guard  Patient has handicap accessible home and DME. He has been non weightbearing on his leg so is used to Using walker, crutches an or wheelchair. From an orthopedic stand point can discharge today. Follow up with Dr Sharol Given 1 week.
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
## 2019-11-20 NOTE — Plan of Care (Signed)
  Problem: Education: Goal: Knowledge of General Education information will improve Description: Including pain rating scale, medication(s)/side effects and non-pharmacologic comfort measures Outcome: Progressing   Problem: Education: Goal: Required Educational Video(s) Outcome: Progressing   Problem: Clinical Measurements: Goal: Ability to maintain clinical measurements within normal limits will improve Outcome: Progressing Goal: Postoperative complications will be avoided or minimized Outcome: Progressing   Problem: Skin Integrity: Goal: Demonstration of wound healing without infection will improve Outcome: Progressing   

## 2019-11-20 NOTE — Care Management Important Message (Signed)
Important Message  Patient Details  Name: Adam Lynch MRN: [id number] Date of Birth: Oct 29, 1956   Medicare Important Message Given:  Yes     [name] 11/20/2019, 2:40 PM
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
## 2019-11-20 NOTE — Discharge Instructions (Signed)
Leg Amputation, Care After This sheet gives you information about how to care for yourself after your procedure. Your health care provider may also give you more specific instructions. If you have problems or questions, contact your health care provider. What can I expect after the procedure? After the procedure, it is common to have:  A little blood or fluid coming from your incision.  Pain from your incision.  Pain that feels like it is coming from the leg that has been removed (phantom pain). This can last for a year or longer.  Skin breakdown on your stump (residual limb).  Feelings of depression, anxiety, and fear. Follow these instructions at home: Medicines  Take over-the-counter and prescription medicines only as told by your health care provider.  If you were prescribed an antibiotic medicine, take it as told by your health care provider. Do not stop taking the antibiotic even if you start to feel better. Bathing  Do not take baths, swim, use a hot tub, or get your residual limb wet until your health care provider approves. You may only be allowed to take sponge baths.  Ask your health care provider when you may start taking showers. After taking a shower, make sure to rinse and dry your residual limb carefully. Incision care   Check your residual limb, especially your incision area, every day. Check for: ? More redness, swelling, or pain. ? More fluid or blood. ? Warmth. ? Pus or a bad smell. ? Blisters. ? Scrapes.  Follow instructions from your health care provider about how to take care of your incision. Make sure you: ? Wash your hands with soap and water before you change your bandage (dressing). If soap and water are not available, use hand sanitizer. ? Change your dressing as told by your health care provider. ? Leave stitches (sutures), skin glue, or adhesive strips in place. These skin closures may need to stay in place for 2 weeks or longer. If adhesive strip  edges start to loosen and curl up, you may trim the loose edges. Do not remove adhesive strips completely unless your health care provider tells you to do that. Activity  Return to your normal activities as told by your health care provider. Ask your health care provider what activities are safe for you.  Do physical therapy exercises as told by your health care provider.  If you have been fitted with an artificial leg (prosthesis) or have been given crutches, use them as told by your health care provider. Eating and drinking  Eat a healthy diet that includes whole grains, fruits and vegetables, low-fat dairy products, and lean proteins.  Drink enough fluid to keep your urine pale yellow. Driving  Work with an occupational therapist to learn new strategies for safe driving with an amputation.  Do not drive or use heavy equipment while taking prescription pain medicine. General instructions  To prevent or treat constipation while you are taking prescription pain medicine, your health care provider may recommend that you: ? Drink enough fluid to keep your urine pale yellow. ? Take over-the-counter or prescription medicines. ? Eat foods that are high in fiber, such as fresh fruits and vegetables, whole grains, and beans. ? Limit foods that are high in fat and processed sugars, such as fried and sweet foods.  Do not use oils, lotion, cream, or rubbing alcohol on the remaining part of your leg.  Wear compression stockings as told by your health care provider.  If you have trouble coping   with your amputation, contact your health care provider. Some feelings of depression, anxiety, or fear are normal after an amputation, but if you struggle with these feelings or if they get overwhelming, your provider may be able to recommend a therapist or support group to help you.  Do not use any products that contain nicotine or tobacco, such as cigarettes and e-cigarettes. These can delay bone healing.  If you need help quitting, ask your health care provider.  Keep all follow-up visits as told by your health care provider. This is important. Contact a health care provider if:  You have a fever.  You have more tenderness in your residual limb.  You have a rash or itchy skin.  You have a cough or chills and you feel achy and weak.  You have trouble coping with your amputation.  You have blisters or scrapes on your residual limb. Get help right away if:  You have severe pain in your residual limb.  You have more redness, swelling, or pain around your incision.  You have more fluid or blood coming from your incision.  Your incision feels warm to the touch, tender, and painful.  You have pus or a bad smell coming from your incision.  You feel light-headed and have shortness of breath.  You have blood-soaked bandages.  You cough up blood.  You have chest pain or pain when taking a deep breath or coughing. If you have these symptoms, do not drive yourself to the hospital. Call emergency services right away. If you ever feel like you may hurt yourself or others, or have thoughts about taking your own life, get help right away. You can go to your nearest emergency department or call:  Your local emergency services (911 in the [place]  A suicide crisis helpline, such as the National Suicide Prevention Lifeline at 1-800-273-8255. This is open 24 hours a day. Summary  After a leg amputation, you may have pain that feels like it is coming from the leg that was removed (phantom pain). This can last for a year or longer.  Follow instructions from your health care provider about how to take care of your incision.  Check your residual limb, especially your incision area, every day. More redness, swelling, or pain may be a sign of infection.  Contact your health care provider if you have trouble coping with your amputation. This information is not intended to replace advice given to  you by your health care provider. Make sure you discuss any questions you have with your health care provider. Document Revised: 02/14/2017 Document Reviewed: 02/14/2017 Elsevier Patient Education  2020 Elsevier Inc.  

## 2019-11-23 NOTE — Anesthesia Postprocedure Evaluation (Signed)
Anesthesia Post Note  Patient: Adam Lynch [name]  Procedure(s) Performed: RIGHT BELOW KNEE AMPUTATION (Right Knee)     Patient location during evaluation: PACU Anesthesia Type: General Level of consciousness: awake and alert and oriented Pain management: pain level controlled Vital Signs Assessment: post-procedure vital signs reviewed and stable Respiratory status: spontaneous breathing, nonlabored ventilation and respiratory function stable Cardiovascular status: blood pressure returned to baseline Postop Assessment: no apparent nausea or vomiting Anesthetic complications: no                    Kaylyn Layer

## 2019-11-24 ENCOUNTER — Other Ambulatory Visit: Payer: Self-pay | Admitting: Orthopedic Surgery

## 2019-11-24 ENCOUNTER — Other Ambulatory Visit: Payer: Self-pay

## 2019-11-24 ENCOUNTER — Telehealth: Payer: Self-pay | Admitting: Orthopedic Surgery

## 2019-11-24 LAB — SURGICAL PATHOLOGY

## 2019-11-24 MED ORDER — OXYCODONE-ACETAMINOPHEN 10-325 MG PO TABS: 1.0000 | ORAL_TABLET | Freq: Three times a day (TID) | ORAL | 0 refills | Status: DC | PRN

## 2019-11-24 NOTE — Telephone Encounter (Signed)
Pt called in said he had amputation surgery with Dr.Duda 11-19-2019 and was suppose to have some pain medication sent to his pharmacy but they haven't received anything? Please have that sent to [facility].   [phone]

## 2019-11-24 NOTE — Patient Outreach (Signed)
[facility]) Care Management  11/24/2019  KERRI ASCHE 08-Jul-1956 [id number]   EMMI- General Discharge RED ON EMMI ALERT Day # 1 Date: 11/22/19 Red Alert Reason:  Scheduled follow-up? No  Unfilled prescriptions? Yes  Wounds healing well? No    Outreach attempt: Telephone call to patient.  He states he is frustrated at this time.  He states that home health has not started and he has a wound that he is not sure of the status as it has not been undressed.  He states he has quite a bit of pain and waiting for pain medication from his doctor. He states he called this AM.  [facility] and he states he called a couple of times but no one has responded. Patient states he has follow up with his surgeon on 11-26-19 and his friends help him with appointments and errands as he cannot drive right now. Advised patient that CM would call [facility] for follow up and call him back.   Telephone call to [facility],  They state that patient is on schedule for tomorrow.  Advised that patient has a surgical wound and that he has been discharged since 11-20-2019 and that his wound needs to be changed and he needs to be seen as soon as possible.  She states she will work on referral and try to get someone out there as soon as possible.  Telephone call back to patient to advise on above.  He verbalized understanding and appreciative of the help.  He declined any further needs at this time and agreeable for nurse follow up call on tomorrow.     Plan: RN CM will follow up with patient on tomorrow and patient agreeable.    [name], RN, MSN [facility] Care Management Care Management Coordinator Direct Line [phone] Toll Free: [phone]  Fax: [phone]

## 2019-11-24 NOTE — Telephone Encounter (Signed)
Pt is s/p A BKA 11/19/19 he is requesting rx for pain medication. Narcotic search shows pt received Oxycodone 10 mg # 40 on 11/05/19, hydromorphone 2mg  #40 on 10/27/19 and Oxycodone 10 mg #40 on 10/21/19 do you wish to fill?

## 2019-11-24 NOTE — Telephone Encounter (Signed)
Rx sent for percocet 10mg 

## 2019-11-25 ENCOUNTER — Other Ambulatory Visit: Payer: Self-pay

## 2019-11-25 ENCOUNTER — Telehealth: Payer: Self-pay

## 2019-11-25 DIAGNOSIS — F419 Anxiety disorder, unspecified: Secondary | ICD-10-CM | POA: Diagnosis not present

## 2019-11-25 DIAGNOSIS — N250 Renal osteodystrophy: Secondary | ICD-10-CM | POA: Diagnosis not present

## 2019-11-25 DIAGNOSIS — T8140XA Infection following a procedure, unspecified, initial encounter: Secondary | ICD-10-CM | POA: Diagnosis not present

## 2019-11-25 DIAGNOSIS — I10 Essential (primary) hypertension: Secondary | ICD-10-CM | POA: Diagnosis not present

## 2019-11-25 DIAGNOSIS — A4159 Other Gram-negative sepsis: Secondary | ICD-10-CM | POA: Diagnosis not present

## 2019-11-25 DIAGNOSIS — J449 Chronic obstructive pulmonary disease, unspecified: Secondary | ICD-10-CM | POA: Diagnosis not present

## 2019-11-25 DIAGNOSIS — M86171 Other acute osteomyelitis, right ankle and foot: Secondary | ICD-10-CM | POA: Diagnosis not present

## 2019-11-25 DIAGNOSIS — D649 Anemia, unspecified: Secondary | ICD-10-CM | POA: Diagnosis not present

## 2019-11-25 DIAGNOSIS — E43 Unspecified severe protein-calorie malnutrition: Secondary | ICD-10-CM | POA: Diagnosis not present

## 2019-11-25 DIAGNOSIS — J441 Chronic obstructive pulmonary disease with (acute) exacerbation: Secondary | ICD-10-CM | POA: Diagnosis not present

## 2019-11-25 NOTE — Telephone Encounter (Signed)
Patient called and left a VM concerning a Rx for pain.  Talked with patient and advised him that a Rx for Percocet 10mg  was sent to his pharmacy on Monday, 11/24/2019.

## 2019-11-25 NOTE — Patient Outreach (Signed)
[facility]) Care Management  11/25/2019  Adam Lynch 24-Mar-1956 [id number]   Telephone call to patient for EMMI follow up.  No answer.  HIPAA compliant voice message left.  Plan: RN CM will attempt patient again within 4 business days and send a letter.  [name], RN, MSN [facility] Care Management Care Management Coordinator Direct Line [phone] Cell [phone] Toll Free: [phone]  Fax: [phone]

## 2019-11-26 ENCOUNTER — Other Ambulatory Visit: Payer: Self-pay

## 2019-11-26 ENCOUNTER — Encounter: Payer: Self-pay | Admitting: Physician Assistant

## 2019-11-26 ENCOUNTER — Ambulatory Visit (INDEPENDENT_AMBULATORY_CARE_PROVIDER_SITE_OTHER): Payer: Medicare HMO | Admitting: Physician Assistant

## 2019-11-26 DIAGNOSIS — N250 Renal osteodystrophy: Secondary | ICD-10-CM | POA: Diagnosis not present

## 2019-11-26 DIAGNOSIS — E43 Unspecified severe protein-calorie malnutrition: Secondary | ICD-10-CM | POA: Diagnosis not present

## 2019-11-26 DIAGNOSIS — J449 Chronic obstructive pulmonary disease, unspecified: Secondary | ICD-10-CM | POA: Diagnosis not present

## 2019-11-26 DIAGNOSIS — F419 Anxiety disorder, unspecified: Secondary | ICD-10-CM | POA: Diagnosis not present

## 2019-11-26 DIAGNOSIS — D649 Anemia, unspecified: Secondary | ICD-10-CM | POA: Diagnosis not present

## 2019-11-26 DIAGNOSIS — A4159 Other Gram-negative sepsis: Secondary | ICD-10-CM | POA: Diagnosis not present

## 2019-11-26 DIAGNOSIS — M86171 Other acute osteomyelitis, right ankle and foot: Secondary | ICD-10-CM

## 2019-11-26 DIAGNOSIS — T8140XA Infection following a procedure, unspecified, initial encounter: Secondary | ICD-10-CM | POA: Diagnosis not present

## 2019-11-26 DIAGNOSIS — I10 Essential (primary) hypertension: Secondary | ICD-10-CM | POA: Diagnosis not present

## 2019-11-26 MED ORDER — OXYCODONE-ACETAMINOPHEN 10-325 MG PO TABS: 1.0000 | ORAL_TABLET | ORAL | 0 refills | Status: AC | PRN

## 2019-11-26 NOTE — Progress Notes (Signed)
Office Visit Note   Patient: Adam Lynch           Date of Birth: 03/19/1956           MRN: [id number] Visit Date: 11/26/2019              Requested by: Arlyss Repress, [address] [place],  [place] [address] PCP: Arlyss Repress, MD  Chief Complaint  Patient presents with  . Right Knee - Routine Post Op    Right BKA 11/19/19      HPI: This is a pleasant gentleman who is approximately 1 week status post right below-knee amputation overall he is doing well.  He is requesting a refill on his pain medication  Assessment & Plan: Visit Diagnoses: No diagnosis found.  Plan: I counseled him regarding pain medication usage.  He will follow up in 1 week  Follow-Up Instructions: No follow-ups on file.   Ortho Exam  Patient is alert, oriented, no adenopathy, well-dressed, normal affect, normal respiratory effort. Focused exam swelling is well controlled wound is healing no cellulitis  Imaging: No results found.   Labs: Lab Results  Component Value Date   REPTSTATUS 11/14/2019 FINAL 11/13/2019   GRAMSTAIN  11/13/2019    NO WBC SEEN FEW GRAM NEGATIVE COCCOBACILLI Performed at [facility], [place] [address], [place], [place] [address]    CULT  11/13/2019    NO GROWTH Performed at [facility] [address] [place], [place] [address]    LABORGA SERRATIA MARCESCENS 11/13/2019   LABORGA PROTEUS MIRABILIS 11/13/2019     Lab Results  Component Value Date   ALBUMIN 2.4 (L) 11/14/2019   ALBUMIN 3.1 (L) 11/12/2019   ALBUMIN 3.6 10/11/2019   PREALBUMIN 12.8 (L) 11/14/2019    No results found for: MG No results found for: VD25OH  Lab Results  Component Value Date   PREALBUMIN 12.8 (L) 11/14/2019   CBC EXTENDED Latest Ref Rng & Units 11/15/2019 11/14/2019 11/12/2019  WBC 4.0 - 10.5 K/uL 7.3 7.1 9.4  RBC 4.22 - 5.81 MIL/uL 2.87(L) 2.96(L) 3.78(L)  HGB 13.0 - 17.0 g/dL 8.8(L) 9.2(L) 11.6(L)  HCT 39.0 - 52.0 % 27.6(L) 28.2(L) 36.6(L)   PLT 150 - 400 K/uL 385 376 461(H)  NEUTROABS 1.7 - 7.7 K/uL - - 6.7  LYMPHSABS 0.7 - 4.0 K/uL - - 1.7     There is no height or weight on file to calculate BMI.  Orders:  No orders of the defined types were placed in this encounter.  Meds ordered this encounter  Medications  . oxyCODONE-acetaminophen (PERCOCET) 10-325 MG tablet    Sig: Take 1 tablet by mouth every 4 (four) hours as needed for pain.    Dispense:  21 tablet    Refill:  0     Procedures: No procedures performed  Clinical Data: No additional findings.  ROS:  All other systems negative, except as noted in the HPI. Review of Systems  Objective: Vital Signs: There were no vitals taken for this visit.  Specialty Comments:  No specialty comments available.  PMFS History: Patient Active Problem List   Diagnosis Date Noted  . Severe protein-calorie malnutrition ([place])   . Acute osteomyelitis of right calcaneus ([place])   . Sepsis ([place]) 11/13/2019  . Postoperative wound infection 11/13/2019  . Transient hypotension 11/13/2019  . COPD without exacerbation ([place]) 11/13/2019  . Normocytic anemia 11/13/2019  . Hypoalbuminemia 11/13/2019  . Displaced avulsion fracture of tuberosity of right  calcaneus, initial encounter for open fracture 10/12/2019  . Fracture of calcaneus, right, open 10/12/2019   Past Medical History:  Diagnosis Date  . COPD (chronic obstructive pulmonary disease) (HCC)   . DJD (degenerative joint disease)   . Hyperlipidemia   . Hypertension     Family History  Problem Relation Age of Onset  . Brain cancer Father   . Lung cancer Paternal Uncle   . Lung cancer Paternal Grandfather     Past Surgical History:  Procedure Laterality Date  . AMPUTATION Right 11/19/2019   Procedure: RIGHT BELOW KNEE AMPUTATION;  Surgeon: Nadara Mustard, MD;  Location: [facility] OR;  Service: Orthopedics;  Laterality: Right;  . CHOLECYSTECTOMY  1990  . HEMORROIDECTOMY  2011  . I & D EXTREMITY Right 10/11/2019    Procedure: IRRIGATION AND DEBRIDEMENT RIGHT ANKLE;  Surgeon: Yolonda Kida, MD;  Location: [facility] OR;  Service: Orthopedics;  Laterality: Right;  . I & D EXTREMITY Right 11/13/2019   Procedure: IRRIGATION AND DEBRIDEMENT EXTREMITY;  Surgeon: Yolonda Kida, MD;  Location: [facility] OR;  Service: Orthopedics;  Laterality: Right;  . lower back surgery   1985   pt had 5 back surgeries total in same year  . ORIF CALCANEOUS FRACTURE Right 10/11/2019   Procedure: Open Reduction Internal Fixation (Orif) Calcaneous Fracture and  Ankle Medial Malleolus ;  Surgeon: Yolonda Kida, MD;  Location: [facility] OR;  Service: Orthopedics;  Laterality: Right;  . REPLACEMENT TOTAL KNEE Left 2013  . TONSILLECTOMY  1964   Social History   Occupational History  . Not on file  Tobacco Use  . Smoking status: Current Every Day Smoker  . Smokeless tobacco: Never Used  Substance and Sexual Activity  . Alcohol use: Yes  . Drug use: Not Currently  . Sexual activity: Not on file

## 2019-11-26 NOTE — Patient Outreach (Signed)
[facility]) Care Management  11/26/2019  Adam Lynch 01/26/1956 [id number]   Telephone call to patient for EMMI follow up.  No answer.  HIPAA compliant voice message left.  Plan: RN CM will attempt patient again within 4 business days.  [name], RN, MSN [facility] Care Management Care Management Coordinator Direct Line [phone] Cell [phone] Toll Free: [phone]  Fax: [phone]

## 2019-11-26 NOTE — Patient Outreach (Signed)
[facility]) Care Management  11/26/2019  Adam Lynch April 21, 1956 [id number]   Incoming call from patient.  He states that home health has made contact and did his dressing change on yesterday. No issues with wound.  He states patient is still bothering him and that he has percocet ordered but not really helping.  He states he will discuss pain with the doctor on his follow up call today.  He is appreciative of follow up and declines further follow up.    Plan: RN CM will close case.   [name], RN, MSN [facility] Care Management Care Management Coordinator Direct Line [phone] Cell [phone] Toll Free: [phone]  Fax: [phone]

## 2019-11-27 ENCOUNTER — Ambulatory Visit: Payer: Self-pay

## 2019-11-27 DIAGNOSIS — M86171 Other acute osteomyelitis, right ankle and foot: Secondary | ICD-10-CM | POA: Diagnosis not present

## 2019-11-27 DIAGNOSIS — N250 Renal osteodystrophy: Secondary | ICD-10-CM | POA: Diagnosis not present

## 2019-11-27 DIAGNOSIS — J449 Chronic obstructive pulmonary disease, unspecified: Secondary | ICD-10-CM | POA: Diagnosis not present

## 2019-11-27 DIAGNOSIS — A4159 Other Gram-negative sepsis: Secondary | ICD-10-CM | POA: Diagnosis not present

## 2019-11-27 DIAGNOSIS — T8140XA Infection following a procedure, unspecified, initial encounter: Secondary | ICD-10-CM | POA: Diagnosis not present

## 2019-11-27 DIAGNOSIS — D649 Anemia, unspecified: Secondary | ICD-10-CM | POA: Diagnosis not present

## 2019-11-27 DIAGNOSIS — I10 Essential (primary) hypertension: Secondary | ICD-10-CM | POA: Diagnosis not present

## 2019-11-27 DIAGNOSIS — E43 Unspecified severe protein-calorie malnutrition: Secondary | ICD-10-CM | POA: Diagnosis not present

## 2019-11-27 DIAGNOSIS — F419 Anxiety disorder, unspecified: Secondary | ICD-10-CM | POA: Diagnosis not present

## 2019-11-30 DIAGNOSIS — F419 Anxiety disorder, unspecified: Secondary | ICD-10-CM | POA: Diagnosis not present

## 2019-11-30 DIAGNOSIS — E43 Unspecified severe protein-calorie malnutrition: Secondary | ICD-10-CM | POA: Diagnosis not present

## 2019-11-30 DIAGNOSIS — M86171 Other acute osteomyelitis, right ankle and foot: Secondary | ICD-10-CM | POA: Diagnosis not present

## 2019-11-30 DIAGNOSIS — N250 Renal osteodystrophy: Secondary | ICD-10-CM | POA: Diagnosis not present

## 2019-11-30 DIAGNOSIS — T8140XA Infection following a procedure, unspecified, initial encounter: Secondary | ICD-10-CM | POA: Diagnosis not present

## 2019-11-30 DIAGNOSIS — J449 Chronic obstructive pulmonary disease, unspecified: Secondary | ICD-10-CM | POA: Diagnosis not present

## 2019-11-30 DIAGNOSIS — A4159 Other Gram-negative sepsis: Secondary | ICD-10-CM | POA: Diagnosis not present

## 2019-11-30 DIAGNOSIS — I10 Essential (primary) hypertension: Secondary | ICD-10-CM | POA: Diagnosis not present

## 2019-11-30 DIAGNOSIS — D649 Anemia, unspecified: Secondary | ICD-10-CM | POA: Diagnosis not present

## 2019-12-02 DIAGNOSIS — M86171 Other acute osteomyelitis, right ankle and foot: Secondary | ICD-10-CM | POA: Diagnosis not present

## 2019-12-02 DIAGNOSIS — T8140XA Infection following a procedure, unspecified, initial encounter: Secondary | ICD-10-CM | POA: Diagnosis not present

## 2019-12-02 DIAGNOSIS — N250 Renal osteodystrophy: Secondary | ICD-10-CM | POA: Diagnosis not present

## 2019-12-02 DIAGNOSIS — I10 Essential (primary) hypertension: Secondary | ICD-10-CM | POA: Diagnosis not present

## 2019-12-02 DIAGNOSIS — A4159 Other Gram-negative sepsis: Secondary | ICD-10-CM | POA: Diagnosis not present

## 2019-12-02 DIAGNOSIS — E43 Unspecified severe protein-calorie malnutrition: Secondary | ICD-10-CM | POA: Diagnosis not present

## 2019-12-02 DIAGNOSIS — D649 Anemia, unspecified: Secondary | ICD-10-CM | POA: Diagnosis not present

## 2019-12-02 DIAGNOSIS — J449 Chronic obstructive pulmonary disease, unspecified: Secondary | ICD-10-CM | POA: Diagnosis not present

## 2019-12-02 DIAGNOSIS — F419 Anxiety disorder, unspecified: Secondary | ICD-10-CM | POA: Diagnosis not present

## 2019-12-04 ENCOUNTER — Other Ambulatory Visit: Payer: Self-pay

## 2019-12-04 ENCOUNTER — Encounter: Payer: Self-pay | Admitting: Physician Assistant

## 2019-12-04 ENCOUNTER — Ambulatory Visit (INDEPENDENT_AMBULATORY_CARE_PROVIDER_SITE_OTHER): Payer: Medicare HMO | Admitting: Orthopedic Surgery

## 2019-12-04 VITALS — Ht 68.0 in | Wt 197.0 lb

## 2019-12-04 DIAGNOSIS — Z89511 Acquired absence of right leg below knee: Secondary | ICD-10-CM

## 2019-12-04 MED ORDER — TEMAZEPAM 7.5 MG PO CAPS: 7.5000 mg | ORAL_CAPSULE | Freq: Every evening | ORAL | 0 refills | Status: AC | PRN

## 2019-12-04 NOTE — Progress Notes (Signed)
Office Visit Note   Patient: Adam Lynch           Date of Birth: 1956/05/10           MRN: [id number] Visit Date: 12/04/2019              Requested by: Mattie Marlin, MD [address] [place],  [place] [address] PCP: Mattie Marlin, MD  Chief Complaint  Patient presents with  . Right Leg - Routine Post Op    11/19/19 right BKA       HPI: Patient is a 64 year old gentleman who presents 2 weeks status post right transtibial amputation.  Patient states he developed some abrasions in the popliteal fossa from his limb protector.  States he is working on extension with physical therapy denies pain states he feels well  Assessment & Plan: Visit Diagnoses:  1. History of below knee amputation, right (HCC)     Plan: Recommended continue aggressively working on knee extension he lacks about 10 degrees we will call in a prescription for Restoril to help with his sleep he will follow-up with [facility] for prosthetic fitting.  Follow-Up Instructions: Return in about 2 weeks (around 12/18/2019).   Ortho Exam  Patient is alert, oriented, no adenopathy, well-dressed, normal affect, normal respiratory effort. Examination the incision is well approximated there is no redness no cellulitis no drainage no swelling.  He lacks 10 degrees to full extension.  We will harvest the staples today.  Imaging: No results found.   Labs: Lab Results  Component Value Date   REPTSTATUS 11/14/2019 FINAL 11/13/2019   GRAMSTAIN  11/13/2019    NO WBC SEEN FEW GRAM NEGATIVE COCCOBACILLI Performed at [facility], [address] [place], [place] [address]    CULT  11/13/2019    NO GROWTH Performed at [facility], [address], [place], [place] [address]    [facility] SERRATIA MARCESCENS 11/13/2019   LABORGA PROTEUS MIRABILIS 11/13/2019     Lab Results  Component Value Date   ALBUMIN 2.4 (L) 11/14/2019   ALBUMIN 3.1 (L) 11/12/2019   ALBUMIN 3.6 10/11/2019   PREALBUMIN 12.8 (L) 11/14/2019    No results found for: MG No results found for: VD25OH  Lab Results  Component Value Date   PREALBUMIN 12.8 (L) 11/14/2019   CBC EXTENDED Latest Ref Rng & Units 11/15/2019 11/14/2019 11/12/2019  WBC 4.0 - 10.5 K/uL 7.3 7.1 9.4  RBC 4.22 - 5.81 MIL/uL 2.87(L) 2.96(L) 3.78(L)  HGB 13.0 - 17.0 g/dL 4.6(N) 6.2(X) 11.6(L)  HCT 39.0 - 52.0 % 27.6(L) 28.2(L) 36.6(L)  PLT 150 - 400 K/uL 385 376 461(H)  NEUTROABS 1.7 - 7.7 K/uL - - 6.7  LYMPHSABS 0.7 - 4.0 K/uL - - 1.7     Body mass index is 29.95 kg/m.  Orders:  No orders of the defined types were placed in this encounter.  No orders of the defined types were placed in this encounter.    Procedures: No procedures performed  Clinical Data: No additional findings.  ROS:  All other systems negative, except as noted in the HPI. Review of Systems  Objective: Vital Signs: Ht 5\' 8"  (1.727 m)   Wt 197 lb (89.4 kg)   BMI 29.95 kg/m   Specialty Comments:  No specialty comments available.  PMFS History: Patient Active Problem List   Diagnosis Date Noted  . Severe protein-calorie malnutrition (HCC)   . Acute osteomyelitis of right calcaneus (HCC)   . Sepsis (HCC)  11/13/2019  . Postoperative wound infection 11/13/2019  . Transient hypotension 11/13/2019  . COPD without exacerbation ([place]) 11/13/2019  . Normocytic anemia 11/13/2019  . Hypoalbuminemia 11/13/2019  . Displaced avulsion fracture of tuberosity of right calcaneus, initial encounter for open fracture 10/12/2019  . Fracture of calcaneus, right, open 10/12/2019   Past Medical History:  Diagnosis Date  . COPD (chronic obstructive pulmonary disease) ([place])   . DJD (degenerative joint disease)   . Hyperlipidemia   . Hypertension     Family History  Problem Relation Age of Onset  . Brain cancer Father   . Lung cancer Paternal Uncle   . Lung cancer Paternal Grandfather     Past Surgical History:  Procedure Laterality Date  .  AMPUTATION Right 11/19/2019   Procedure: RIGHT BELOW KNEE AMPUTATION;  Surgeon: Newt Minion, MD;  Location: [place];  Service: Orthopedics;  Laterality: Right;  . CHOLECYSTECTOMY  1990  . HEMORROIDECTOMY  2011  . I & D EXTREMITY Right 10/11/2019   Procedure: IRRIGATION AND DEBRIDEMENT RIGHT ANKLE;  Surgeon: Nicholes Stairs, MD;  Location: [place];  Service: Orthopedics;  Laterality: Right;  . I & D EXTREMITY Right 11/13/2019   Procedure: IRRIGATION AND DEBRIDEMENT EXTREMITY;  Surgeon: Nicholes Stairs, MD;  Location: [place];  Service: Orthopedics;  Laterality: Right;  . lower back surgery   1985   pt had 5 back surgeries total in same year  . ORIF CALCANEOUS FRACTURE Right 10/11/2019   Procedure: Open Reduction Internal Fixation (Orif) Calcaneous Fracture and  Ankle Medial Malleolus ;  Surgeon: Nicholes Stairs, MD;  Location: [place];  Service: Orthopedics;  Laterality: Right;  . REPLACEMENT TOTAL KNEE Left 2013  . TONSILLECTOMY  1964   Social History   Occupational History  . Not on file  Tobacco Use  . Smoking status: Current Every Day Smoker  . Smokeless tobacco: Never Used  Substance and Sexual Activity  . Alcohol use: Yes  . Drug use: Not Currently  . Sexual activity: Not on file

## 2019-12-09 DIAGNOSIS — T8140XA Infection following a procedure, unspecified, initial encounter: Secondary | ICD-10-CM | POA: Diagnosis not present

## 2019-12-09 DIAGNOSIS — N250 Renal osteodystrophy: Secondary | ICD-10-CM | POA: Diagnosis not present

## 2019-12-09 DIAGNOSIS — J449 Chronic obstructive pulmonary disease, unspecified: Secondary | ICD-10-CM | POA: Diagnosis not present

## 2019-12-09 DIAGNOSIS — F419 Anxiety disorder, unspecified: Secondary | ICD-10-CM | POA: Diagnosis not present

## 2019-12-09 DIAGNOSIS — M86171 Other acute osteomyelitis, right ankle and foot: Secondary | ICD-10-CM | POA: Diagnosis not present

## 2019-12-09 DIAGNOSIS — E43 Unspecified severe protein-calorie malnutrition: Secondary | ICD-10-CM | POA: Diagnosis not present

## 2019-12-09 DIAGNOSIS — I10 Essential (primary) hypertension: Secondary | ICD-10-CM | POA: Diagnosis not present

## 2019-12-09 DIAGNOSIS — A4159 Other Gram-negative sepsis: Secondary | ICD-10-CM | POA: Diagnosis not present

## 2019-12-09 DIAGNOSIS — D649 Anemia, unspecified: Secondary | ICD-10-CM | POA: Diagnosis not present

## 2019-12-10 DIAGNOSIS — E43 Unspecified severe protein-calorie malnutrition: Secondary | ICD-10-CM | POA: Diagnosis not present

## 2019-12-10 DIAGNOSIS — A4159 Other Gram-negative sepsis: Secondary | ICD-10-CM | POA: Diagnosis not present

## 2019-12-10 DIAGNOSIS — I10 Essential (primary) hypertension: Secondary | ICD-10-CM | POA: Diagnosis not present

## 2019-12-10 DIAGNOSIS — N250 Renal osteodystrophy: Secondary | ICD-10-CM | POA: Diagnosis not present

## 2019-12-10 DIAGNOSIS — F419 Anxiety disorder, unspecified: Secondary | ICD-10-CM | POA: Diagnosis not present

## 2019-12-10 DIAGNOSIS — T8140XA Infection following a procedure, unspecified, initial encounter: Secondary | ICD-10-CM | POA: Diagnosis not present

## 2019-12-10 DIAGNOSIS — J449 Chronic obstructive pulmonary disease, unspecified: Secondary | ICD-10-CM | POA: Diagnosis not present

## 2019-12-10 DIAGNOSIS — M86171 Other acute osteomyelitis, right ankle and foot: Secondary | ICD-10-CM | POA: Diagnosis not present

## 2019-12-10 DIAGNOSIS — D649 Anemia, unspecified: Secondary | ICD-10-CM | POA: Diagnosis not present

## 2019-12-12 DIAGNOSIS — I10 Essential (primary) hypertension: Secondary | ICD-10-CM | POA: Diagnosis not present

## 2019-12-12 DIAGNOSIS — T8140XA Infection following a procedure, unspecified, initial encounter: Secondary | ICD-10-CM | POA: Diagnosis not present

## 2019-12-12 DIAGNOSIS — E43 Unspecified severe protein-calorie malnutrition: Secondary | ICD-10-CM | POA: Diagnosis not present

## 2019-12-12 DIAGNOSIS — M86171 Other acute osteomyelitis, right ankle and foot: Secondary | ICD-10-CM | POA: Diagnosis not present

## 2019-12-12 DIAGNOSIS — F419 Anxiety disorder, unspecified: Secondary | ICD-10-CM | POA: Diagnosis not present

## 2019-12-12 DIAGNOSIS — N250 Renal osteodystrophy: Secondary | ICD-10-CM | POA: Diagnosis not present

## 2019-12-12 DIAGNOSIS — A4159 Other Gram-negative sepsis: Secondary | ICD-10-CM | POA: Diagnosis not present

## 2019-12-12 DIAGNOSIS — J449 Chronic obstructive pulmonary disease, unspecified: Secondary | ICD-10-CM | POA: Diagnosis not present

## 2019-12-12 DIAGNOSIS — D649 Anemia, unspecified: Secondary | ICD-10-CM | POA: Diagnosis not present

## 2019-12-15 DIAGNOSIS — J449 Chronic obstructive pulmonary disease, unspecified: Secondary | ICD-10-CM | POA: Diagnosis not present

## 2019-12-15 DIAGNOSIS — E43 Unspecified severe protein-calorie malnutrition: Secondary | ICD-10-CM | POA: Diagnosis not present

## 2019-12-15 DIAGNOSIS — N250 Renal osteodystrophy: Secondary | ICD-10-CM | POA: Diagnosis not present

## 2019-12-15 DIAGNOSIS — F419 Anxiety disorder, unspecified: Secondary | ICD-10-CM | POA: Diagnosis not present

## 2019-12-15 DIAGNOSIS — A4159 Other Gram-negative sepsis: Secondary | ICD-10-CM | POA: Diagnosis not present

## 2019-12-15 DIAGNOSIS — T8140XA Infection following a procedure, unspecified, initial encounter: Secondary | ICD-10-CM | POA: Diagnosis not present

## 2019-12-15 DIAGNOSIS — I10 Essential (primary) hypertension: Secondary | ICD-10-CM | POA: Diagnosis not present

## 2019-12-15 DIAGNOSIS — D649 Anemia, unspecified: Secondary | ICD-10-CM | POA: Diagnosis not present

## 2019-12-15 DIAGNOSIS — M86171 Other acute osteomyelitis, right ankle and foot: Secondary | ICD-10-CM | POA: Diagnosis not present

## 2019-12-16 DIAGNOSIS — M86171 Other acute osteomyelitis, right ankle and foot: Secondary | ICD-10-CM | POA: Diagnosis not present

## 2019-12-16 DIAGNOSIS — D649 Anemia, unspecified: Secondary | ICD-10-CM | POA: Diagnosis not present

## 2019-12-16 DIAGNOSIS — J449 Chronic obstructive pulmonary disease, unspecified: Secondary | ICD-10-CM | POA: Diagnosis not present

## 2019-12-16 DIAGNOSIS — I10 Essential (primary) hypertension: Secondary | ICD-10-CM | POA: Diagnosis not present

## 2019-12-16 DIAGNOSIS — N250 Renal osteodystrophy: Secondary | ICD-10-CM | POA: Diagnosis not present

## 2019-12-16 DIAGNOSIS — E43 Unspecified severe protein-calorie malnutrition: Secondary | ICD-10-CM | POA: Diagnosis not present

## 2019-12-16 DIAGNOSIS — A4159 Other Gram-negative sepsis: Secondary | ICD-10-CM | POA: Diagnosis not present

## 2019-12-16 DIAGNOSIS — F419 Anxiety disorder, unspecified: Secondary | ICD-10-CM | POA: Diagnosis not present

## 2019-12-16 DIAGNOSIS — T8140XA Infection following a procedure, unspecified, initial encounter: Secondary | ICD-10-CM | POA: Diagnosis not present

## 2019-12-18 ENCOUNTER — Ambulatory Visit (INDEPENDENT_AMBULATORY_CARE_PROVIDER_SITE_OTHER): Payer: Medicare HMO | Admitting: Orthopedic Surgery

## 2019-12-18 ENCOUNTER — Other Ambulatory Visit: Payer: Self-pay

## 2019-12-18 ENCOUNTER — Encounter: Payer: Self-pay | Admitting: Orthopedic Surgery

## 2019-12-18 DIAGNOSIS — M86171 Other acute osteomyelitis, right ankle and foot: Secondary | ICD-10-CM

## 2019-12-18 NOTE — Progress Notes (Signed)
Office Visit Note   Patient: Adam Lynch           Date of Birth: 06-27-1956           MRN: [id number] Visit Date: 12/18/2019              Requested by: Arlyss Repress, [address] [place],  [place] [address] PCP: Arlyss Repress, MD  Chief Complaint  Patient presents with  . Right Leg - Routine Post Op      HPI: This is a pleasant gentleman who is approximately 4 weeks status post right below-knee amputation he is doing well and working with occupational and physical therapy.  He does use a wheelchair which is medically necessary for him to get around his house as he does not yet have a prosthetic  Assessment & Plan: Visit Diagnoses: No diagnosis found.  Plan: I stressed the importance of putting the shrinker against the skin.  He will follow up in 2 weeks and I am hopeful we can send him to have his prosthetic fitted at that time.  We will also work on a wheelchair for him.  This is medically necessary as he is nonweightbearing on the right and is an amputee  Follow-Up Instructions: No follow-ups on file.   Ortho Exam  Patient is alert, oriented, no adenopathy, well-dressed, normal affect, normal respiratory effort.  Focused examination of the stump incision is healed he has 1 small area on the end that is still has some slight drainage more of an abrasion without any surrounding erythema just pinpoint serous drainage no foul odor  Imaging: No results found. No images are attached to the encounter.  Labs: Lab Results  Component Value Date   REPTSTATUS 11/14/2019 FINAL 11/13/2019   GRAMSTAIN  11/13/2019    NO WBC SEEN FEW GRAM NEGATIVE COCCOBACILLI Performed at [facility], [place] [address], [place], [place] [address]    CULT  11/13/2019    NO GROWTH Performed at [place] [address], [place], [place] [address]    [facility]OR[facility] SERRATIA MARCESCENS 11/13/2019   LABORGA PROTEUS MIRABILIS 11/13/2019     Lab Results  Component  Value Date   ALBUMIN 2.4 (L) 11/14/2019   ALBUMIN 3.1 (L) 11/12/2019   ALBUMIN 3.6 10/11/2019   PREALBUMIN 12.8 (L) 11/14/2019    No results found for: MG No results found for: VD25OH  Lab Results  Component Value Date   PREALBUMIN 12.8 (L) 11/14/2019   CBC EXTENDED Latest Ref Rng & Units 11/15/2019 11/14/2019 11/12/2019  WBC 4.0 - 10.5 K/uL 7.3 7.1 9.4  RBC 4.22 - 5.81 MIL/uL 2.87(L) 2.96(L) 3.78(L)  HGB 13.0 - 17.0 g/dL 8.8(L) 9.2(L) 11.6(L)  HCT 39.0 - 52.0 % 27.6(L) 28.2(L) 36.6(L)  PLT 150 - 400 K/uL 385 376 461(H)  NEUTROABS 1.7 - 7.7 K/uL - - 6.7  LYMPHSABS 0.7 - 4.0 K/uL - - 1.7     There is no height or weight on file to calculate BMI.  Orders:  No orders of the defined types were placed in this encounter.  No orders of the defined types were placed in this encounter.    Procedures: No procedures performed  Clinical Data: No additional findings.  ROS:  All other systems negative, except as noted in the HPI. Review of Systems  Objective: Vital Signs: There were no vitals taken for this visit.  Specialty Comments:  No specialty comments available.  PMFS History: Patient Active Problem List  Diagnosis Date Noted  . Severe protein-calorie malnutrition (HCC)   . Acute osteomyelitis of right calcaneus (HCC)   . Sepsis (HCC) 11/13/2019  . Postoperative wound infection 11/13/2019  . Transient hypotension 11/13/2019  . COPD without exacerbation (HCC) 11/13/2019  . Normocytic anemia 11/13/2019  . Hypoalbuminemia 11/13/2019  . Displaced avulsion fracture of tuberosity of right calcaneus, initial encounter for open fracture 10/12/2019  . Fracture of calcaneus, right, open 10/12/2019   Past Medical History:  Diagnosis Date  . COPD (chronic obstructive pulmonary disease) (HCC)   . DJD (degenerative joint disease)   . Hyperlipidemia   . Hypertension     Family History  Problem Relation Age of Onset  . Brain cancer Father   . Lung cancer Paternal  Uncle   . Lung cancer Paternal Grandfather     Past Surgical History:  Procedure Laterality Date  . AMPUTATION Right 11/19/2019   Procedure: RIGHT BELOW KNEE AMPUTATION;  Surgeon: Nadara Mustard, MD;  Location: [facility] OR;  Service: Orthopedics;  Laterality: Right;  . CHOLECYSTECTOMY  1990  . HEMORROIDECTOMY  2011  . I & D EXTREMITY Right 10/11/2019   Procedure: IRRIGATION AND DEBRIDEMENT RIGHT ANKLE;  Surgeon: Yolonda Kida, MD;  Location: [facility] OR;  Service: Orthopedics;  Laterality: Right;  . I & D EXTREMITY Right 11/13/2019   Procedure: IRRIGATION AND DEBRIDEMENT EXTREMITY;  Surgeon: Yolonda Kida, MD;  Location: [facility] OR;  Service: Orthopedics;  Laterality: Right;  . lower back surgery   1985   pt had 5 back surgeries total in same year  . ORIF CALCANEOUS FRACTURE Right 10/11/2019   Procedure: Open Reduction Internal Fixation (Orif) Calcaneous Fracture and  Ankle Medial Malleolus ;  Surgeon: Yolonda Kida, MD;  Location: [facility] OR;  Service: Orthopedics;  Laterality: Right;  . REPLACEMENT TOTAL KNEE Left 2013  . TONSILLECTOMY  1964   Social History   Occupational History  . Not on file  Tobacco Use  . Smoking status: Current Every Day Smoker  . Smokeless tobacco: Never Used  Substance and Sexual Activity  . Alcohol use: Yes  . Drug use: Not Currently  . Sexual activity: Not on file

## 2019-12-22 DIAGNOSIS — I10 Essential (primary) hypertension: Secondary | ICD-10-CM | POA: Diagnosis not present

## 2019-12-22 DIAGNOSIS — J431 Panlobular emphysema: Secondary | ICD-10-CM | POA: Diagnosis not present

## 2019-12-22 DIAGNOSIS — N250 Renal osteodystrophy: Secondary | ICD-10-CM | POA: Diagnosis not present

## 2019-12-22 DIAGNOSIS — D649 Anemia, unspecified: Secondary | ICD-10-CM | POA: Diagnosis not present

## 2019-12-22 DIAGNOSIS — E43 Unspecified severe protein-calorie malnutrition: Secondary | ICD-10-CM | POA: Diagnosis not present

## 2019-12-22 DIAGNOSIS — Z0000 Encounter for general adult medical examination without abnormal findings: Secondary | ICD-10-CM | POA: Diagnosis not present

## 2019-12-22 DIAGNOSIS — F411 Generalized anxiety disorder: Secondary | ICD-10-CM | POA: Diagnosis not present

## 2019-12-22 DIAGNOSIS — T8140XA Infection following a procedure, unspecified, initial encounter: Secondary | ICD-10-CM | POA: Diagnosis not present

## 2019-12-22 DIAGNOSIS — A4159 Other Gram-negative sepsis: Secondary | ICD-10-CM | POA: Diagnosis not present

## 2019-12-22 DIAGNOSIS — M86171 Other acute osteomyelitis, right ankle and foot: Secondary | ICD-10-CM | POA: Diagnosis not present

## 2019-12-22 DIAGNOSIS — J449 Chronic obstructive pulmonary disease, unspecified: Secondary | ICD-10-CM | POA: Diagnosis not present

## 2019-12-22 DIAGNOSIS — F419 Anxiety disorder, unspecified: Secondary | ICD-10-CM | POA: Diagnosis not present

## 2019-12-23 DIAGNOSIS — N250 Renal osteodystrophy: Secondary | ICD-10-CM | POA: Diagnosis not present

## 2019-12-23 DIAGNOSIS — F419 Anxiety disorder, unspecified: Secondary | ICD-10-CM | POA: Diagnosis not present

## 2019-12-23 DIAGNOSIS — D649 Anemia, unspecified: Secondary | ICD-10-CM | POA: Diagnosis not present

## 2019-12-23 DIAGNOSIS — T8140XA Infection following a procedure, unspecified, initial encounter: Secondary | ICD-10-CM | POA: Diagnosis not present

## 2019-12-23 DIAGNOSIS — E43 Unspecified severe protein-calorie malnutrition: Secondary | ICD-10-CM | POA: Diagnosis not present

## 2019-12-23 DIAGNOSIS — A4159 Other Gram-negative sepsis: Secondary | ICD-10-CM | POA: Diagnosis not present

## 2019-12-23 DIAGNOSIS — J449 Chronic obstructive pulmonary disease, unspecified: Secondary | ICD-10-CM | POA: Diagnosis not present

## 2019-12-23 DIAGNOSIS — I10 Essential (primary) hypertension: Secondary | ICD-10-CM | POA: Diagnosis not present

## 2019-12-23 DIAGNOSIS — M86171 Other acute osteomyelitis, right ankle and foot: Secondary | ICD-10-CM | POA: Diagnosis not present

## 2019-12-25 DIAGNOSIS — D649 Anemia, unspecified: Secondary | ICD-10-CM | POA: Diagnosis not present

## 2019-12-25 DIAGNOSIS — I10 Essential (primary) hypertension: Secondary | ICD-10-CM | POA: Diagnosis not present

## 2019-12-25 DIAGNOSIS — E43 Unspecified severe protein-calorie malnutrition: Secondary | ICD-10-CM | POA: Diagnosis not present

## 2019-12-25 DIAGNOSIS — N250 Renal osteodystrophy: Secondary | ICD-10-CM | POA: Diagnosis not present

## 2019-12-25 DIAGNOSIS — M86171 Other acute osteomyelitis, right ankle and foot: Secondary | ICD-10-CM | POA: Diagnosis not present

## 2019-12-25 DIAGNOSIS — A4159 Other Gram-negative sepsis: Secondary | ICD-10-CM | POA: Diagnosis not present

## 2019-12-25 DIAGNOSIS — F419 Anxiety disorder, unspecified: Secondary | ICD-10-CM | POA: Diagnosis not present

## 2019-12-25 DIAGNOSIS — T8140XA Infection following a procedure, unspecified, initial encounter: Secondary | ICD-10-CM | POA: Diagnosis not present

## 2019-12-25 DIAGNOSIS — J449 Chronic obstructive pulmonary disease, unspecified: Secondary | ICD-10-CM | POA: Diagnosis not present

## 2019-12-25 DIAGNOSIS — J441 Chronic obstructive pulmonary disease with (acute) exacerbation: Secondary | ICD-10-CM | POA: Diagnosis not present

## 2019-12-29 ENCOUNTER — Telehealth: Payer: Self-pay

## 2019-12-29 NOTE — Telephone Encounter (Signed)
Order has been placed in [facility] for [facility] to process request. Will hold this message and call pt once this has been processed.

## 2019-12-29 NOTE — Telephone Encounter (Signed)
Patient called concerning an order for a wheelchair.  Stated that he has been waiting since his last visit on 12/18/2019 and per Dr. Lajoyce Corners, the wheelchair is medically necessary.  Cb# [phone].  Please advise.  Thank you.

## 2019-12-30 NOTE — Telephone Encounter (Signed)
Called pt and lm on vm to advise that order has been placed and that [employer] will contact the pt out any out of pocket expense and delivery options. To call with any questions.

## 2019-12-31 DIAGNOSIS — J449 Chronic obstructive pulmonary disease, unspecified: Secondary | ICD-10-CM | POA: Diagnosis not present

## 2019-12-31 DIAGNOSIS — M86171 Other acute osteomyelitis, right ankle and foot: Secondary | ICD-10-CM | POA: Diagnosis not present

## 2019-12-31 DIAGNOSIS — F419 Anxiety disorder, unspecified: Secondary | ICD-10-CM | POA: Diagnosis not present

## 2019-12-31 DIAGNOSIS — A4159 Other Gram-negative sepsis: Secondary | ICD-10-CM | POA: Diagnosis not present

## 2019-12-31 DIAGNOSIS — E43 Unspecified severe protein-calorie malnutrition: Secondary | ICD-10-CM | POA: Diagnosis not present

## 2019-12-31 DIAGNOSIS — I10 Essential (primary) hypertension: Secondary | ICD-10-CM | POA: Diagnosis not present

## 2019-12-31 DIAGNOSIS — N250 Renal osteodystrophy: Secondary | ICD-10-CM | POA: Diagnosis not present

## 2019-12-31 DIAGNOSIS — D649 Anemia, unspecified: Secondary | ICD-10-CM | POA: Diagnosis not present

## 2019-12-31 DIAGNOSIS — T8140XA Infection following a procedure, unspecified, initial encounter: Secondary | ICD-10-CM | POA: Diagnosis not present

## 2020-01-01 ENCOUNTER — Encounter: Payer: Self-pay | Admitting: Orthopedic Surgery

## 2020-01-01 ENCOUNTER — Other Ambulatory Visit: Payer: Self-pay

## 2020-01-01 ENCOUNTER — Ambulatory Visit (INDEPENDENT_AMBULATORY_CARE_PROVIDER_SITE_OTHER): Payer: Medicare HMO | Admitting: Orthopedic Surgery

## 2020-01-01 VITALS — Ht 68.0 in | Wt 197.0 lb

## 2020-01-01 DIAGNOSIS — D649 Anemia, unspecified: Secondary | ICD-10-CM | POA: Diagnosis not present

## 2020-01-01 DIAGNOSIS — A4159 Other Gram-negative sepsis: Secondary | ICD-10-CM | POA: Diagnosis not present

## 2020-01-01 DIAGNOSIS — N250 Renal osteodystrophy: Secondary | ICD-10-CM | POA: Diagnosis not present

## 2020-01-01 DIAGNOSIS — I10 Essential (primary) hypertension: Secondary | ICD-10-CM | POA: Diagnosis not present

## 2020-01-01 DIAGNOSIS — T8140XA Infection following a procedure, unspecified, initial encounter: Secondary | ICD-10-CM | POA: Diagnosis not present

## 2020-01-01 DIAGNOSIS — E43 Unspecified severe protein-calorie malnutrition: Secondary | ICD-10-CM | POA: Diagnosis not present

## 2020-01-01 DIAGNOSIS — F419 Anxiety disorder, unspecified: Secondary | ICD-10-CM | POA: Diagnosis not present

## 2020-01-01 DIAGNOSIS — M86171 Other acute osteomyelitis, right ankle and foot: Secondary | ICD-10-CM

## 2020-01-01 DIAGNOSIS — J449 Chronic obstructive pulmonary disease, unspecified: Secondary | ICD-10-CM | POA: Diagnosis not present

## 2020-01-01 NOTE — Progress Notes (Signed)
Office Visit Note   Patient: Adam Lynch           Date of Birth: 12-Dec-1955           MRN: [id number] Visit Date: 01/01/2020              Requested by: Mattie Marlin, MD [address] [place],  [place] [address] PCP: Mattie Marlin, MD  Chief Complaint  Patient presents with  . Right Leg - Routine Post Op    11/19/19 right BKA       HPI: This is a pleasant 64 year old gentleman who is now 7 weeks status post right below-knee amputation.  He has been wearing a shrinker.  He is doing extremely well and is ready to go forward with getting fit for prosthetic  Assessment & Plan: Visit Diagnoses: No diagnosis found.  Plan: I have given him a prescription for his socket and supplies.  Also given him a prescription for smaller shrinkers.  He will follow-up once he obtains his prosthetic Patient is a new right transtibial  amputee.  Patient's current comorbidities are not expected to impact the ability to function with the prescribed prosthesis. Patient verbally communicates a strong desire to use a prosthesis. Patient currently requires mobility aids to ambulate without a prosthesis.  Expects not to use mobility aids with a new prosthesis.  Patient is a K2 level ambulator that will use a prosthesis to walk around their home and the community over low level environmental barriers.   The patient will benefit from the continued use of MPK technology. Follow-Up Instructions: No follow-ups on file.   Ortho Exam  Patient is alert, oriented, no adenopathy, well-dressed, normal affect, normal respiratory effort. Focused examination demonstrates well-healed surgical incision no erythema edema some very minimal scabbing.  No fluctuance healthy wound edges have completely healed  Imaging: No results found.   Labs: Lab Results  Component Value Date   REPTSTATUS 11/14/2019 FINAL 11/13/2019   GRAMSTAIN  11/13/2019    NO WBC SEEN FEW GRAM NEGATIVE  COCCOBACILLI Performed at [facility], [address] [place], [place] [address]    CULT  11/13/2019    NO GROWTH Performed at [facility], [address] [place], [place] [address]    [facility] SERRATIA MARCESCENS 11/13/2019   LABORGA PROTEUS MIRABILIS 11/13/2019     Lab Results  Component Value Date   ALBUMIN 2.4 (L) 11/14/2019   ALBUMIN 3.1 (L) 11/12/2019   ALBUMIN 3.6 10/11/2019   PREALBUMIN 12.8 (L) 11/14/2019    No results found for: MG No results found for: VD25OH  Lab Results  Component Value Date   PREALBUMIN 12.8 (L) 11/14/2019   CBC EXTENDED Latest Ref Rng & Units 11/15/2019 11/14/2019 11/12/2019  WBC 4.0 - 10.5 K/uL 7.3 7.1 9.4  RBC 4.22 - 5.81 MIL/uL 2.87(L) 2.96(L) 3.78(L)  HGB 13.0 - 17.0 g/dL 3.5(W) 6.5(K) 11.6(L)  HCT 39.0 - 52.0 % 27.6(L) 28.2(L) 36.6(L)  PLT 150 - 400 K/uL 385 376 461(H)  NEUTROABS 1.7 - 7.7 K/uL - - 6.7  LYMPHSABS 0.7 - 4.0 K/uL - - 1.7     Body mass index is 29.95 kg/m.  Orders:  No orders of the defined types were placed in this encounter.  No orders of the defined types were placed in this encounter.    Procedures: No procedures performed  Clinical Data: No additional findings.  ROS:  All other systems negative, except as noted in the HPI. Review  of Systems  Objective: Vital Signs: Ht 5\' 8"  (1.727 m)   Wt 197 lb (89.4 kg)   BMI 29.95 kg/m   Specialty Comments:  No specialty comments available.  PMFS History: Patient Active Problem List   Diagnosis Date Noted  . Severe protein-calorie malnutrition ([place])   . Acute osteomyelitis of right calcaneus ([place])   . Sepsis ([place]) 11/13/2019  . Postoperative wound infection 11/13/2019  . Transient hypotension 11/13/2019  . COPD without exacerbation ([place]) 11/13/2019  . Normocytic anemia 11/13/2019  . Hypoalbuminemia 11/13/2019  . Displaced avulsion fracture of tuberosity of right calcaneus, initial encounter for open fracture 10/12/2019  . Fracture  of calcaneus, right, open 10/12/2019   Past Medical History:  Diagnosis Date  . COPD (chronic obstructive pulmonary disease) ([place])   . DJD (degenerative joint disease)   . Hyperlipidemia   . Hypertension     Family History  Problem Relation Age of Onset  . Brain cancer Father   . Lung cancer Paternal Uncle   . Lung cancer Paternal Grandfather     Past Surgical History:  Procedure Laterality Date  . AMPUTATION Right 11/19/2019   Procedure: RIGHT BELOW KNEE AMPUTATION;  Surgeon: Newt Minion, MD;  Location: [place];  Service: Orthopedics;  Laterality: Right;  . CHOLECYSTECTOMY  1990  . HEMORROIDECTOMY  2011  . I & D EXTREMITY Right 10/11/2019   Procedure: IRRIGATION AND DEBRIDEMENT RIGHT ANKLE;  Surgeon: Nicholes Stairs, MD;  Location: [facility];  Service: Orthopedics;  Laterality: Right;  . I & D EXTREMITY Right 11/13/2019   Procedure: IRRIGATION AND DEBRIDEMENT EXTREMITY;  Surgeon: Nicholes Stairs, MD;  Location: [facility];  Service: Orthopedics;  Laterality: Right;  . lower back surgery   1985   pt had 5 back surgeries total in same year  . ORIF CALCANEOUS FRACTURE Right 10/11/2019   Procedure: Open Reduction Internal Fixation (Orif) Calcaneous Fracture and  Ankle Medial Malleolus ;  Surgeon: Nicholes Stairs, MD;  Location: [place];  Service: Orthopedics;  Laterality: Right;  . REPLACEMENT TOTAL KNEE Left 2013  . TONSILLECTOMY  1964   Social History   Occupational History  . Not on file  Tobacco Use  . Smoking status: Current Every Day Smoker  . Smokeless tobacco: Never Used  Substance and Sexual Activity  . Alcohol use: Yes  . Drug use: Not Currently  . Sexual activity: Not on file

## 2020-01-02 DIAGNOSIS — F411 Generalized anxiety disorder: Secondary | ICD-10-CM | POA: Diagnosis not present

## 2020-01-02 DIAGNOSIS — I10 Essential (primary) hypertension: Secondary | ICD-10-CM | POA: Diagnosis not present

## 2020-01-02 DIAGNOSIS — R195 Other fecal abnormalities: Secondary | ICD-10-CM | POA: Diagnosis not present

## 2020-01-02 DIAGNOSIS — I739 Peripheral vascular disease, unspecified: Secondary | ICD-10-CM | POA: Diagnosis not present

## 2020-01-05 DIAGNOSIS — T8140XA Infection following a procedure, unspecified, initial encounter: Secondary | ICD-10-CM | POA: Diagnosis not present

## 2020-01-05 DIAGNOSIS — E43 Unspecified severe protein-calorie malnutrition: Secondary | ICD-10-CM | POA: Diagnosis not present

## 2020-01-05 DIAGNOSIS — F419 Anxiety disorder, unspecified: Secondary | ICD-10-CM | POA: Diagnosis not present

## 2020-01-05 DIAGNOSIS — D649 Anemia, unspecified: Secondary | ICD-10-CM | POA: Diagnosis not present

## 2020-01-05 DIAGNOSIS — A4159 Other Gram-negative sepsis: Secondary | ICD-10-CM | POA: Diagnosis not present

## 2020-01-05 DIAGNOSIS — M86171 Other acute osteomyelitis, right ankle and foot: Secondary | ICD-10-CM | POA: Diagnosis not present

## 2020-01-05 DIAGNOSIS — I10 Essential (primary) hypertension: Secondary | ICD-10-CM | POA: Diagnosis not present

## 2020-01-05 DIAGNOSIS — N250 Renal osteodystrophy: Secondary | ICD-10-CM | POA: Diagnosis not present

## 2020-01-05 DIAGNOSIS — J449 Chronic obstructive pulmonary disease, unspecified: Secondary | ICD-10-CM | POA: Diagnosis not present

## 2020-01-06 ENCOUNTER — Telehealth: Payer: Self-pay | Admitting: Physician Assistant

## 2020-01-06 DIAGNOSIS — N250 Renal osteodystrophy: Secondary | ICD-10-CM | POA: Diagnosis not present

## 2020-01-06 DIAGNOSIS — T8140XA Infection following a procedure, unspecified, initial encounter: Secondary | ICD-10-CM | POA: Diagnosis not present

## 2020-01-06 DIAGNOSIS — M86171 Other acute osteomyelitis, right ankle and foot: Secondary | ICD-10-CM | POA: Diagnosis not present

## 2020-01-06 DIAGNOSIS — A4159 Other Gram-negative sepsis: Secondary | ICD-10-CM | POA: Diagnosis not present

## 2020-01-06 DIAGNOSIS — E43 Unspecified severe protein-calorie malnutrition: Secondary | ICD-10-CM | POA: Diagnosis not present

## 2020-01-06 DIAGNOSIS — D649 Anemia, unspecified: Secondary | ICD-10-CM | POA: Diagnosis not present

## 2020-01-06 DIAGNOSIS — I10 Essential (primary) hypertension: Secondary | ICD-10-CM | POA: Diagnosis not present

## 2020-01-06 DIAGNOSIS — F419 Anxiety disorder, unspecified: Secondary | ICD-10-CM | POA: Diagnosis not present

## 2020-01-06 DIAGNOSIS — J449 Chronic obstructive pulmonary disease, unspecified: Secondary | ICD-10-CM | POA: Diagnosis not present

## 2020-01-06 NOTE — Telephone Encounter (Signed)
[facility] advised they spoke with the pt this morning and that he is set up for delivery tomorrow. I called to verify with the pt and he also advised of above. Will call with any other questions.

## 2020-01-06 NOTE — Telephone Encounter (Signed)
Another message was sent to [facility]. The order was placed last Monday. I checked the status of the ordr on Thursday and messaged again today to check the status of the wheelchair. They were supposed to reach out to the pt and advise of any possible co pay and delivery date would be. Will hold pending advisement

## 2020-01-06 NOTE — Telephone Encounter (Signed)
Patient called advised he does not have his wheelchair yet and is very upset. It has been 3 weeks now. The number to contact is [phone]

## 2020-01-12 DIAGNOSIS — F419 Anxiety disorder, unspecified: Secondary | ICD-10-CM | POA: Diagnosis not present

## 2020-01-12 DIAGNOSIS — M86171 Other acute osteomyelitis, right ankle and foot: Secondary | ICD-10-CM | POA: Diagnosis not present

## 2020-01-12 DIAGNOSIS — J449 Chronic obstructive pulmonary disease, unspecified: Secondary | ICD-10-CM | POA: Diagnosis not present

## 2020-01-12 DIAGNOSIS — N250 Renal osteodystrophy: Secondary | ICD-10-CM | POA: Diagnosis not present

## 2020-01-12 DIAGNOSIS — D649 Anemia, unspecified: Secondary | ICD-10-CM | POA: Diagnosis not present

## 2020-01-12 DIAGNOSIS — A4159 Other Gram-negative sepsis: Secondary | ICD-10-CM | POA: Diagnosis not present

## 2020-01-12 DIAGNOSIS — E43 Unspecified severe protein-calorie malnutrition: Secondary | ICD-10-CM | POA: Diagnosis not present

## 2020-01-12 DIAGNOSIS — I10 Essential (primary) hypertension: Secondary | ICD-10-CM | POA: Diagnosis not present

## 2020-01-12 DIAGNOSIS — T8140XA Infection following a procedure, unspecified, initial encounter: Secondary | ICD-10-CM | POA: Diagnosis not present

## 2020-01-15 DIAGNOSIS — I10 Essential (primary) hypertension: Secondary | ICD-10-CM | POA: Diagnosis not present

## 2020-01-15 DIAGNOSIS — F419 Anxiety disorder, unspecified: Secondary | ICD-10-CM | POA: Diagnosis not present

## 2020-01-15 DIAGNOSIS — E43 Unspecified severe protein-calorie malnutrition: Secondary | ICD-10-CM | POA: Diagnosis not present

## 2020-01-15 DIAGNOSIS — T8140XA Infection following a procedure, unspecified, initial encounter: Secondary | ICD-10-CM | POA: Diagnosis not present

## 2020-01-15 DIAGNOSIS — M86171 Other acute osteomyelitis, right ankle and foot: Secondary | ICD-10-CM | POA: Diagnosis not present

## 2020-01-15 DIAGNOSIS — J449 Chronic obstructive pulmonary disease, unspecified: Secondary | ICD-10-CM | POA: Diagnosis not present

## 2020-01-15 DIAGNOSIS — N250 Renal osteodystrophy: Secondary | ICD-10-CM | POA: Diagnosis not present

## 2020-01-15 DIAGNOSIS — D649 Anemia, unspecified: Secondary | ICD-10-CM | POA: Diagnosis not present

## 2020-01-15 DIAGNOSIS — A4159 Other Gram-negative sepsis: Secondary | ICD-10-CM | POA: Diagnosis not present

## 2020-01-20 DIAGNOSIS — I10 Essential (primary) hypertension: Secondary | ICD-10-CM | POA: Diagnosis not present

## 2020-01-20 DIAGNOSIS — Z6835 Body mass index (BMI) 35.0-35.9, adult: Secondary | ICD-10-CM | POA: Diagnosis not present

## 2020-01-20 DIAGNOSIS — F411 Generalized anxiety disorder: Secondary | ICD-10-CM | POA: Diagnosis not present

## 2020-01-21 DIAGNOSIS — N250 Renal osteodystrophy: Secondary | ICD-10-CM | POA: Diagnosis not present

## 2020-01-21 DIAGNOSIS — F419 Anxiety disorder, unspecified: Secondary | ICD-10-CM | POA: Diagnosis not present

## 2020-01-21 DIAGNOSIS — A4159 Other Gram-negative sepsis: Secondary | ICD-10-CM | POA: Diagnosis not present

## 2020-01-21 DIAGNOSIS — I10 Essential (primary) hypertension: Secondary | ICD-10-CM | POA: Diagnosis not present

## 2020-01-21 DIAGNOSIS — M86171 Other acute osteomyelitis, right ankle and foot: Secondary | ICD-10-CM | POA: Diagnosis not present

## 2020-01-21 DIAGNOSIS — T8140XA Infection following a procedure, unspecified, initial encounter: Secondary | ICD-10-CM | POA: Diagnosis not present

## 2020-01-21 DIAGNOSIS — E43 Unspecified severe protein-calorie malnutrition: Secondary | ICD-10-CM | POA: Diagnosis not present

## 2020-01-21 DIAGNOSIS — D649 Anemia, unspecified: Secondary | ICD-10-CM | POA: Diagnosis not present

## 2020-01-21 DIAGNOSIS — J449 Chronic obstructive pulmonary disease, unspecified: Secondary | ICD-10-CM | POA: Diagnosis not present

## 2020-01-28 ENCOUNTER — Telehealth: Payer: Self-pay | Admitting: Radiology

## 2020-01-28 NOTE — Telephone Encounter (Signed)
Patient called the triage phone, states that he has as an appt tomorrow with Dr. Lajoyce Corners but he thinks that Dr. Lajoyce Corners wanted to see him back after his final fitting to his prosthetic but that is not till next Tuesday so he was wondering if he needs to move his appt or not for tomorrow please call him to advise.

## 2020-01-28 NOTE — Telephone Encounter (Signed)
Called pt and appt has been moved to 02/18/20 pt will call with any questions.

## 2020-01-29 ENCOUNTER — Ambulatory Visit: Payer: Medicare HMO | Admitting: Physician Assistant

## 2020-02-17 DIAGNOSIS — Z89511 Acquired absence of right leg below knee: Secondary | ICD-10-CM | POA: Diagnosis not present

## 2020-02-18 ENCOUNTER — Ambulatory Visit (INDEPENDENT_AMBULATORY_CARE_PROVIDER_SITE_OTHER): Payer: Medicare HMO | Admitting: Family

## 2020-02-18 ENCOUNTER — Other Ambulatory Visit: Payer: Self-pay

## 2020-02-18 ENCOUNTER — Encounter: Payer: Self-pay | Admitting: Family

## 2020-02-18 VITALS — Ht 68.0 in | Wt 197.0 lb

## 2020-02-18 DIAGNOSIS — Z89511 Acquired absence of right leg below knee: Secondary | ICD-10-CM

## 2020-02-18 NOTE — Progress Notes (Signed)
   Post-Op Visit Note   Patient: Adam Lynch           Date of Birth: [birth year]           MRN: [id number] Visit Date: 02/18/2020 PCP: Mattie Marlin, MD  Chief Complaint:  Chief Complaint  Patient presents with  . Right Leg - Routine Post Op    11/18/20 right  BKA     HPI:  HPI The patient is a 64 year old gentleman status post right below-knee amputation December 30.  Received his prosthetic this week he is full weightbearing and feels great.  He is using a walker and starts physical therapy tomorrow.  Ortho Exam On examination of his right residual limb incision is well-healed there is no erythema no impending skin breakdown no callus  Visit Diagnoses: No diagnosis found.  Plan: He will slowly advance his weightbearing as tolerated in his prosthetic follow-up in the office as needed.  Follow-Up Instructions: Return if symptoms worsen or fail to improve.   Imaging: No results found.  Orders:  No orders of the defined types were placed in this encounter.  No orders of the defined types were placed in this encounter.    PMFS History: Patient Active Problem List   Diagnosis Date Noted  . Severe protein-calorie malnutrition (HCC)   . Acute osteomyelitis of right calcaneus (HCC)   . Sepsis (HCC) 11/13/2019  . Postoperative wound infection 11/13/2019  . Transient hypotension 11/13/2019  . COPD without exacerbation (HCC) 11/13/2019  . Normocytic anemia 11/13/2019  . Hypoalbuminemia 11/13/2019  . Displaced avulsion fracture of tuberosity of right calcaneus, initial encounter for open fracture 10/12/2019  . Fracture of calcaneus, right, open 10/12/2019   Past Medical History:  Diagnosis Date  . COPD (chronic obstructive pulmonary disease) (HCC)   . DJD (degenerative joint disease)   . Hyperlipidemia   . Hypertension     Family History  Problem Relation Age of Onset  . Brain cancer Father   . Lung cancer Paternal Uncle   . Lung cancer Paternal Grandfather     Past Surgical History:  Procedure Laterality Date  . AMPUTATION Right 11/19/2019   Procedure: RIGHT BELOW KNEE AMPUTATION;  Surgeon: Nadara Mustard, MD;  Location: [facility] OR;  Service: Orthopedics;  Laterality: Right;  . CHOLECYSTECTOMY  1990  . HEMORROIDECTOMY  2011  . I & D EXTREMITY Right 10/11/2019   Procedure: IRRIGATION AND DEBRIDEMENT RIGHT ANKLE;  Surgeon: Yolonda Kida, MD;  Location: [facility] OR;  Service: Orthopedics;  Laterality: Right;  . I & D EXTREMITY Right 11/13/2019   Procedure: IRRIGATION AND DEBRIDEMENT EXTREMITY;  Surgeon: Yolonda Kida, MD;  Location: [facility] OR;  Service: Orthopedics;  Laterality: Right;  . lower back surgery   1985   pt had 5 back surgeries total in same year  . ORIF CALCANEOUS FRACTURE Right 10/11/2019   Procedure: Open Reduction Internal Fixation (Orif) Calcaneous Fracture and  Ankle Medial Malleolus ;  Surgeon: Yolonda Kida, MD;  Location: [facility] OR;  Service: Orthopedics;  Laterality: Right;  . REPLACEMENT TOTAL KNEE Left 2013  . TONSILLECTOMY  1964   Social History   Occupational History  . Not on file  Tobacco Use  . Smoking status: Current Every Day Smoker  . Smokeless tobacco: Never Used  Substance and Sexual Activity  . Alcohol use: Yes  . Drug use: Not Currently  . Sexual activity: Not on file

## 2020-02-23 DIAGNOSIS — R2689 Other abnormalities of gait and mobility: Secondary | ICD-10-CM | POA: Diagnosis not present

## 2020-02-23 DIAGNOSIS — Z89512 Acquired absence of left leg below knee: Secondary | ICD-10-CM | POA: Diagnosis not present

## 2020-02-25 DIAGNOSIS — R2689 Other abnormalities of gait and mobility: Secondary | ICD-10-CM | POA: Diagnosis not present

## 2020-02-25 DIAGNOSIS — G546 Phantom limb syndrome with pain: Secondary | ICD-10-CM | POA: Diagnosis not present

## 2020-02-25 DIAGNOSIS — Z89512 Acquired absence of left leg below knee: Secondary | ICD-10-CM | POA: Diagnosis not present

## 2020-03-01 DIAGNOSIS — Z89512 Acquired absence of left leg below knee: Secondary | ICD-10-CM | POA: Diagnosis not present

## 2020-03-01 DIAGNOSIS — G546 Phantom limb syndrome with pain: Secondary | ICD-10-CM | POA: Diagnosis not present

## 2020-03-01 DIAGNOSIS — R2689 Other abnormalities of gait and mobility: Secondary | ICD-10-CM | POA: Diagnosis not present

## 2020-03-02 ENCOUNTER — Telehealth: Payer: Self-pay | Admitting: Orthopedic Surgery

## 2020-03-02 NOTE — Telephone Encounter (Signed)
Patient called requesting the paperwork mailed to him that he needs to get handicap license plate for his car.  CB#[phone].  Thank you.

## 2020-03-03 DIAGNOSIS — Z87891 Personal history of nicotine dependence: Secondary | ICD-10-CM | POA: Diagnosis not present

## 2020-03-03 DIAGNOSIS — R509 Fever, unspecified: Secondary | ICD-10-CM | POA: Diagnosis not present

## 2020-03-03 DIAGNOSIS — R0902 Hypoxemia: Secondary | ICD-10-CM | POA: Diagnosis not present

## 2020-03-03 DIAGNOSIS — R0602 Shortness of breath: Secondary | ICD-10-CM | POA: Diagnosis not present

## 2020-03-03 DIAGNOSIS — Z20822 Contact with and (suspected) exposure to covid-19: Secondary | ICD-10-CM | POA: Diagnosis not present

## 2020-03-03 DIAGNOSIS — J440 Chronic obstructive pulmonary disease with acute lower respiratory infection: Secondary | ICD-10-CM | POA: Diagnosis not present

## 2020-03-03 DIAGNOSIS — R112 Nausea with vomiting, unspecified: Secondary | ICD-10-CM | POA: Diagnosis not present

## 2020-03-03 DIAGNOSIS — J189 Pneumonia, unspecified organism: Secondary | ICD-10-CM | POA: Diagnosis not present

## 2020-03-03 DIAGNOSIS — I10 Essential (primary) hypertension: Secondary | ICD-10-CM | POA: Diagnosis not present

## 2020-03-03 DIAGNOSIS — R001 Bradycardia, unspecified: Secondary | ICD-10-CM | POA: Diagnosis not present

## 2020-03-03 DIAGNOSIS — R531 Weakness: Secondary | ICD-10-CM | POA: Diagnosis not present

## 2020-03-03 NOTE — Telephone Encounter (Signed)
Form complete and pending signature. Will mail tomorrow after signature.

## 2020-03-09 DIAGNOSIS — J189 Pneumonia, unspecified organism: Secondary | ICD-10-CM | POA: Diagnosis not present

## 2020-03-09 DIAGNOSIS — I10 Essential (primary) hypertension: Secondary | ICD-10-CM | POA: Diagnosis not present

## 2020-03-09 DIAGNOSIS — N200 Calculus of kidney: Secondary | ICD-10-CM | POA: Diagnosis not present

## 2020-03-09 DIAGNOSIS — J431 Panlobular emphysema: Secondary | ICD-10-CM | POA: Diagnosis not present

## 2020-03-09 DIAGNOSIS — K219 Gastro-esophageal reflux disease without esophagitis: Secondary | ICD-10-CM | POA: Diagnosis not present

## 2020-03-09 DIAGNOSIS — F411 Generalized anxiety disorder: Secondary | ICD-10-CM | POA: Diagnosis not present

## 2020-03-09 DIAGNOSIS — Z6835 Body mass index (BMI) 35.0-35.9, adult: Secondary | ICD-10-CM | POA: Diagnosis not present

## 2020-03-09 DIAGNOSIS — E119 Type 2 diabetes mellitus without complications: Secondary | ICD-10-CM | POA: Diagnosis not present

## 2020-03-12 DIAGNOSIS — E119 Type 2 diabetes mellitus without complications: Secondary | ICD-10-CM | POA: Diagnosis not present

## 2020-03-12 DIAGNOSIS — J189 Pneumonia, unspecified organism: Secondary | ICD-10-CM | POA: Diagnosis not present

## 2020-03-12 DIAGNOSIS — Z6835 Body mass index (BMI) 35.0-35.9, adult: Secondary | ICD-10-CM | POA: Diagnosis not present

## 2020-03-12 DIAGNOSIS — K219 Gastro-esophageal reflux disease without esophagitis: Secondary | ICD-10-CM | POA: Diagnosis not present

## 2020-03-12 DIAGNOSIS — J431 Panlobular emphysema: Secondary | ICD-10-CM | POA: Diagnosis not present

## 2020-03-12 DIAGNOSIS — N200 Calculus of kidney: Secondary | ICD-10-CM | POA: Diagnosis not present

## 2020-03-12 DIAGNOSIS — I10 Essential (primary) hypertension: Secondary | ICD-10-CM | POA: Diagnosis not present

## 2020-03-12 DIAGNOSIS — F411 Generalized anxiety disorder: Secondary | ICD-10-CM | POA: Diagnosis not present

## 2020-03-15 DIAGNOSIS — N200 Calculus of kidney: Secondary | ICD-10-CM | POA: Diagnosis not present

## 2020-03-15 DIAGNOSIS — J189 Pneumonia, unspecified organism: Secondary | ICD-10-CM | POA: Diagnosis not present

## 2020-03-15 DIAGNOSIS — Z6835 Body mass index (BMI) 35.0-35.9, adult: Secondary | ICD-10-CM | POA: Diagnosis not present

## 2020-03-15 DIAGNOSIS — F411 Generalized anxiety disorder: Secondary | ICD-10-CM | POA: Diagnosis not present

## 2020-03-15 DIAGNOSIS — J431 Panlobular emphysema: Secondary | ICD-10-CM | POA: Diagnosis not present

## 2020-03-15 DIAGNOSIS — E119 Type 2 diabetes mellitus without complications: Secondary | ICD-10-CM | POA: Diagnosis not present

## 2020-03-15 DIAGNOSIS — I10 Essential (primary) hypertension: Secondary | ICD-10-CM | POA: Diagnosis not present

## 2020-03-15 DIAGNOSIS — K219 Gastro-esophageal reflux disease without esophagitis: Secondary | ICD-10-CM | POA: Diagnosis not present

## 2020-03-16 DIAGNOSIS — I10 Essential (primary) hypertension: Secondary | ICD-10-CM | POA: Diagnosis not present

## 2020-03-16 DIAGNOSIS — J431 Panlobular emphysema: Secondary | ICD-10-CM | POA: Diagnosis not present

## 2020-03-16 DIAGNOSIS — F411 Generalized anxiety disorder: Secondary | ICD-10-CM | POA: Diagnosis not present

## 2020-03-16 DIAGNOSIS — N200 Calculus of kidney: Secondary | ICD-10-CM | POA: Diagnosis not present

## 2020-03-16 DIAGNOSIS — J189 Pneumonia, unspecified organism: Secondary | ICD-10-CM | POA: Diagnosis not present

## 2020-03-16 DIAGNOSIS — Z6835 Body mass index (BMI) 35.0-35.9, adult: Secondary | ICD-10-CM | POA: Diagnosis not present

## 2020-03-16 DIAGNOSIS — K219 Gastro-esophageal reflux disease without esophagitis: Secondary | ICD-10-CM | POA: Diagnosis not present

## 2020-03-16 DIAGNOSIS — E119 Type 2 diabetes mellitus without complications: Secondary | ICD-10-CM | POA: Diagnosis not present

## 2020-03-17 DIAGNOSIS — E119 Type 2 diabetes mellitus without complications: Secondary | ICD-10-CM | POA: Diagnosis not present

## 2020-03-17 DIAGNOSIS — J431 Panlobular emphysema: Secondary | ICD-10-CM | POA: Diagnosis not present

## 2020-03-17 DIAGNOSIS — Z6835 Body mass index (BMI) 35.0-35.9, adult: Secondary | ICD-10-CM | POA: Diagnosis not present

## 2020-03-17 DIAGNOSIS — F411 Generalized anxiety disorder: Secondary | ICD-10-CM | POA: Diagnosis not present

## 2020-03-17 DIAGNOSIS — N200 Calculus of kidney: Secondary | ICD-10-CM | POA: Diagnosis not present

## 2020-03-17 DIAGNOSIS — K219 Gastro-esophageal reflux disease without esophagitis: Secondary | ICD-10-CM | POA: Diagnosis not present

## 2020-03-17 DIAGNOSIS — I10 Essential (primary) hypertension: Secondary | ICD-10-CM | POA: Diagnosis not present

## 2020-03-17 DIAGNOSIS — J189 Pneumonia, unspecified organism: Secondary | ICD-10-CM | POA: Diagnosis not present

## 2020-03-19 DIAGNOSIS — E119 Type 2 diabetes mellitus without complications: Secondary | ICD-10-CM | POA: Diagnosis not present

## 2020-03-19 DIAGNOSIS — F411 Generalized anxiety disorder: Secondary | ICD-10-CM | POA: Diagnosis not present

## 2020-03-19 DIAGNOSIS — N200 Calculus of kidney: Secondary | ICD-10-CM | POA: Diagnosis not present

## 2020-03-19 DIAGNOSIS — J189 Pneumonia, unspecified organism: Secondary | ICD-10-CM | POA: Diagnosis not present

## 2020-03-19 DIAGNOSIS — Z6835 Body mass index (BMI) 35.0-35.9, adult: Secondary | ICD-10-CM | POA: Diagnosis not present

## 2020-03-19 DIAGNOSIS — J431 Panlobular emphysema: Secondary | ICD-10-CM | POA: Diagnosis not present

## 2020-03-19 DIAGNOSIS — I10 Essential (primary) hypertension: Secondary | ICD-10-CM | POA: Diagnosis not present

## 2020-03-19 DIAGNOSIS — K219 Gastro-esophageal reflux disease without esophagitis: Secondary | ICD-10-CM | POA: Diagnosis not present

## 2020-03-22 DIAGNOSIS — F411 Generalized anxiety disorder: Secondary | ICD-10-CM | POA: Diagnosis not present

## 2020-03-22 DIAGNOSIS — I10 Essential (primary) hypertension: Secondary | ICD-10-CM | POA: Diagnosis not present

## 2020-03-22 DIAGNOSIS — K219 Gastro-esophageal reflux disease without esophagitis: Secondary | ICD-10-CM | POA: Diagnosis not present

## 2020-03-22 DIAGNOSIS — Z6835 Body mass index (BMI) 35.0-35.9, adult: Secondary | ICD-10-CM | POA: Diagnosis not present

## 2020-03-22 DIAGNOSIS — J431 Panlobular emphysema: Secondary | ICD-10-CM | POA: Diagnosis not present

## 2020-03-22 DIAGNOSIS — E119 Type 2 diabetes mellitus without complications: Secondary | ICD-10-CM | POA: Diagnosis not present

## 2020-03-22 DIAGNOSIS — N200 Calculus of kidney: Secondary | ICD-10-CM | POA: Diagnosis not present

## 2020-03-22 DIAGNOSIS — J189 Pneumonia, unspecified organism: Secondary | ICD-10-CM | POA: Diagnosis not present

## 2020-03-24 DIAGNOSIS — I10 Essential (primary) hypertension: Secondary | ICD-10-CM | POA: Diagnosis not present

## 2020-03-24 DIAGNOSIS — J431 Panlobular emphysema: Secondary | ICD-10-CM | POA: Diagnosis not present

## 2020-03-24 DIAGNOSIS — F411 Generalized anxiety disorder: Secondary | ICD-10-CM | POA: Diagnosis not present

## 2020-03-24 DIAGNOSIS — K219 Gastro-esophageal reflux disease without esophagitis: Secondary | ICD-10-CM | POA: Diagnosis not present

## 2020-03-24 DIAGNOSIS — E119 Type 2 diabetes mellitus without complications: Secondary | ICD-10-CM | POA: Diagnosis not present

## 2020-03-24 DIAGNOSIS — N200 Calculus of kidney: Secondary | ICD-10-CM | POA: Diagnosis not present

## 2020-03-24 DIAGNOSIS — J189 Pneumonia, unspecified organism: Secondary | ICD-10-CM | POA: Diagnosis not present

## 2020-03-24 DIAGNOSIS — Z6835 Body mass index (BMI) 35.0-35.9, adult: Secondary | ICD-10-CM | POA: Diagnosis not present

## 2020-03-25 DIAGNOSIS — K219 Gastro-esophageal reflux disease without esophagitis: Secondary | ICD-10-CM | POA: Diagnosis not present

## 2020-03-25 DIAGNOSIS — Z6835 Body mass index (BMI) 35.0-35.9, adult: Secondary | ICD-10-CM | POA: Diagnosis not present

## 2020-03-25 DIAGNOSIS — J431 Panlobular emphysema: Secondary | ICD-10-CM | POA: Diagnosis not present

## 2020-03-25 DIAGNOSIS — N200 Calculus of kidney: Secondary | ICD-10-CM | POA: Diagnosis not present

## 2020-03-25 DIAGNOSIS — E119 Type 2 diabetes mellitus without complications: Secondary | ICD-10-CM | POA: Diagnosis not present

## 2020-03-25 DIAGNOSIS — F411 Generalized anxiety disorder: Secondary | ICD-10-CM | POA: Diagnosis not present

## 2020-03-25 DIAGNOSIS — I10 Essential (primary) hypertension: Secondary | ICD-10-CM | POA: Diagnosis not present

## 2020-03-25 DIAGNOSIS — J189 Pneumonia, unspecified organism: Secondary | ICD-10-CM | POA: Diagnosis not present

## 2020-03-29 DIAGNOSIS — I10 Essential (primary) hypertension: Secondary | ICD-10-CM | POA: Diagnosis not present

## 2020-03-29 DIAGNOSIS — N200 Calculus of kidney: Secondary | ICD-10-CM | POA: Diagnosis not present

## 2020-03-29 DIAGNOSIS — K219 Gastro-esophageal reflux disease without esophagitis: Secondary | ICD-10-CM | POA: Diagnosis not present

## 2020-03-29 DIAGNOSIS — J189 Pneumonia, unspecified organism: Secondary | ICD-10-CM | POA: Diagnosis not present

## 2020-03-29 DIAGNOSIS — F411 Generalized anxiety disorder: Secondary | ICD-10-CM | POA: Diagnosis not present

## 2020-03-29 DIAGNOSIS — E119 Type 2 diabetes mellitus without complications: Secondary | ICD-10-CM | POA: Diagnosis not present

## 2020-03-29 DIAGNOSIS — Z6835 Body mass index (BMI) 35.0-35.9, adult: Secondary | ICD-10-CM | POA: Diagnosis not present

## 2020-03-29 DIAGNOSIS — J431 Panlobular emphysema: Secondary | ICD-10-CM | POA: Diagnosis not present

## 2020-03-31 DIAGNOSIS — E119 Type 2 diabetes mellitus without complications: Secondary | ICD-10-CM | POA: Diagnosis not present

## 2020-03-31 DIAGNOSIS — J189 Pneumonia, unspecified organism: Secondary | ICD-10-CM | POA: Diagnosis not present

## 2020-03-31 DIAGNOSIS — I10 Essential (primary) hypertension: Secondary | ICD-10-CM | POA: Diagnosis not present

## 2020-03-31 DIAGNOSIS — Z6835 Body mass index (BMI) 35.0-35.9, adult: Secondary | ICD-10-CM | POA: Diagnosis not present

## 2020-03-31 DIAGNOSIS — N200 Calculus of kidney: Secondary | ICD-10-CM | POA: Diagnosis not present

## 2020-03-31 DIAGNOSIS — K219 Gastro-esophageal reflux disease without esophagitis: Secondary | ICD-10-CM | POA: Diagnosis not present

## 2020-03-31 DIAGNOSIS — F411 Generalized anxiety disorder: Secondary | ICD-10-CM | POA: Diagnosis not present

## 2020-03-31 DIAGNOSIS — J431 Panlobular emphysema: Secondary | ICD-10-CM | POA: Diagnosis not present

## 2020-04-01 DIAGNOSIS — I10 Essential (primary) hypertension: Secondary | ICD-10-CM | POA: Diagnosis not present

## 2020-04-01 DIAGNOSIS — F411 Generalized anxiety disorder: Secondary | ICD-10-CM | POA: Diagnosis not present

## 2020-04-01 DIAGNOSIS — N200 Calculus of kidney: Secondary | ICD-10-CM | POA: Diagnosis not present

## 2020-04-01 DIAGNOSIS — J431 Panlobular emphysema: Secondary | ICD-10-CM | POA: Diagnosis not present

## 2020-04-01 DIAGNOSIS — J189 Pneumonia, unspecified organism: Secondary | ICD-10-CM | POA: Diagnosis not present

## 2020-04-01 DIAGNOSIS — E119 Type 2 diabetes mellitus without complications: Secondary | ICD-10-CM | POA: Diagnosis not present

## 2020-04-01 DIAGNOSIS — K219 Gastro-esophageal reflux disease without esophagitis: Secondary | ICD-10-CM | POA: Diagnosis not present

## 2020-04-01 DIAGNOSIS — Z6835 Body mass index (BMI) 35.0-35.9, adult: Secondary | ICD-10-CM | POA: Diagnosis not present

## 2020-04-07 DIAGNOSIS — J189 Pneumonia, unspecified organism: Secondary | ICD-10-CM | POA: Diagnosis not present

## 2020-04-07 DIAGNOSIS — E119 Type 2 diabetes mellitus without complications: Secondary | ICD-10-CM | POA: Diagnosis not present

## 2020-04-07 DIAGNOSIS — Z6835 Body mass index (BMI) 35.0-35.9, adult: Secondary | ICD-10-CM | POA: Diagnosis not present

## 2020-04-07 DIAGNOSIS — I10 Essential (primary) hypertension: Secondary | ICD-10-CM | POA: Diagnosis not present

## 2020-04-07 DIAGNOSIS — J431 Panlobular emphysema: Secondary | ICD-10-CM | POA: Diagnosis not present

## 2020-04-07 DIAGNOSIS — N200 Calculus of kidney: Secondary | ICD-10-CM | POA: Diagnosis not present

## 2020-04-07 DIAGNOSIS — F411 Generalized anxiety disorder: Secondary | ICD-10-CM | POA: Diagnosis not present

## 2020-04-07 DIAGNOSIS — K219 Gastro-esophageal reflux disease without esophagitis: Secondary | ICD-10-CM | POA: Diagnosis not present

## 2020-04-08 DIAGNOSIS — J189 Pneumonia, unspecified organism: Secondary | ICD-10-CM | POA: Diagnosis not present

## 2020-04-08 DIAGNOSIS — I10 Essential (primary) hypertension: Secondary | ICD-10-CM | POA: Diagnosis not present

## 2020-04-08 DIAGNOSIS — F411 Generalized anxiety disorder: Secondary | ICD-10-CM | POA: Diagnosis not present

## 2020-04-08 DIAGNOSIS — Z6835 Body mass index (BMI) 35.0-35.9, adult: Secondary | ICD-10-CM | POA: Diagnosis not present

## 2020-04-08 DIAGNOSIS — N200 Calculus of kidney: Secondary | ICD-10-CM | POA: Diagnosis not present

## 2020-04-08 DIAGNOSIS — K219 Gastro-esophageal reflux disease without esophagitis: Secondary | ICD-10-CM | POA: Diagnosis not present

## 2020-04-08 DIAGNOSIS — J431 Panlobular emphysema: Secondary | ICD-10-CM | POA: Diagnosis not present

## 2020-04-08 DIAGNOSIS — E119 Type 2 diabetes mellitus without complications: Secondary | ICD-10-CM | POA: Diagnosis not present

## 2020-04-14 DIAGNOSIS — K219 Gastro-esophageal reflux disease without esophagitis: Secondary | ICD-10-CM | POA: Diagnosis not present

## 2020-04-14 DIAGNOSIS — N200 Calculus of kidney: Secondary | ICD-10-CM | POA: Diagnosis not present

## 2020-04-14 DIAGNOSIS — F411 Generalized anxiety disorder: Secondary | ICD-10-CM | POA: Diagnosis not present

## 2020-04-14 DIAGNOSIS — I10 Essential (primary) hypertension: Secondary | ICD-10-CM | POA: Diagnosis not present

## 2020-04-14 DIAGNOSIS — Z6835 Body mass index (BMI) 35.0-35.9, adult: Secondary | ICD-10-CM | POA: Diagnosis not present

## 2020-04-14 DIAGNOSIS — E119 Type 2 diabetes mellitus without complications: Secondary | ICD-10-CM | POA: Diagnosis not present

## 2020-04-14 DIAGNOSIS — J431 Panlobular emphysema: Secondary | ICD-10-CM | POA: Diagnosis not present

## 2020-04-14 DIAGNOSIS — J189 Pneumonia, unspecified organism: Secondary | ICD-10-CM | POA: Diagnosis not present

## 2020-06-24 ENCOUNTER — Ambulatory Visit (INDEPENDENT_AMBULATORY_CARE_PROVIDER_SITE_OTHER): Payer: Medicare Other | Admitting: Physician Assistant

## 2020-06-24 ENCOUNTER — Encounter: Payer: Self-pay | Admitting: Physician Assistant

## 2020-06-24 VITALS — Ht 68.0 in | Wt 197.0 lb

## 2020-06-24 DIAGNOSIS — Z89511 Acquired absence of right leg below knee: Secondary | ICD-10-CM | POA: Diagnosis not present

## 2020-06-24 NOTE — Progress Notes (Addendum)
Office Visit Note   Patient: Adam Lynch           Date of Birth: 09-08-1956           MRN: [id number] Visit Date: 06/24/2020              Requested by: Mattie Marlin, MD [address] [place],  [place] [address] PCP: Mattie Marlin, MD  Chief Complaint  Patient presents with  . 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  He is having significant difficulties with his prosthetic because he has had a volume change.  He is currently wearing 3 5 ply socks 1 3 ply sock and 1 1 ply sock.  Despite this the prosthetic is loose.  He has been working with Technical sales engineer to place additional padding but it still has not helped.  He is now getting bruises and irritations on his leg from the loose that fitting prosthetic  Assessment & Plan: Visit Diagnoses: No diagnosis found.  Plan: Patient will follow up as needed a new prescription was provided for a socket and supplies K3 level Patient is an existing right transtibial  amputee.  Patient's current comorbidities are not expected to impact the ability to function with the prescribed prosthesis. Patient verbally communicates a strong desire to use a prosthesis. Patient currently requires mobility aids to ambulate without a prosthesis.  Expects not to use mobility aids with a new prosthesis.  Patient is a K3 level ambulator that spends a lot of time walking around on uneven terrain over obstacles, up and down stairs, and ambulates with a variable cadence.       Follow-Up Instructions: No follow-ups on file.   Ortho Exam  Patient is alert, oriented, no adenopathy, well-dressed, normal affect, normal respiratory effort. Patient has above-mentioned socks on.  Still has room in the front of the prosthetic.  He has had multiple adjustments to the socket.  His incision is well-healed but he is starting to have areas of  bruising and skin irritation from rubbing with a prosthetic   Imaging: No results found. No images are attached to the encounter.  Labs: Lab Results  Component Value Date   REPTSTATUS 11/14/2019 FINAL 11/13/2019   GRAMSTAIN  11/13/2019    NO WBC SEEN FEW GRAM NEGATIVE COCCOBACILLI Performed at [facility], [address], [place], [place] [address]    CULT  11/13/2019    NO GROWTH Performed at [facility], [address] [place], [place] [address]    [facility] SERRATIA MARCESCENS 11/13/2019   LABORGA PROTEUS MIRABILIS 11/13/2019     Lab Results  Component Value Date   ALBUMIN 2.4 (L) 11/14/2019   ALBUMIN 3.1 (L) 11/12/2019   ALBUMIN 3.6 10/11/2019   PREALBUMIN 12.8 (L) 11/14/2019    No results found for: MG No results found for: VD25OH  Lab Results  Component Value Date   PREALBUMIN 12.8 (L) 11/14/2019   CBC EXTENDED Latest Ref Rng & Units 11/15/2019 11/14/2019 11/12/2019  WBC 4.0 - 10.5 K/uL 7.3 7.1 9.4  RBC 4.22 - 5.81 MIL/uL 2.87(L) 2.96(L) 3.78(L)  HGB 13.0 - 17.0 g/dL 9.5(G) 3.8(V) 11.6(L)  HCT 39 - 52 % 27.6(L) 28.2(L) 36.6(L)  PLT 150 - 400 K/uL 385 376 461(H)  NEUTROABS 1.7 - 7.7 K/uL - - 6.7  LYMPHSABS 0.7 - 4.0 K/uL - -  1.7     Body mass index is 29.95 kg/m.  Orders:  No orders of the defined types were placed in this encounter.  No orders of the defined types were placed in this encounter.    Procedures: No procedures performed  Clinical Data: No additional findings.  ROS:  All other systems negative, except as noted in the HPI. Review of Systems  Objective: Vital Signs: Ht 5\' 8"  (1.727 m)   Wt 197 lb (89.4 kg)   BMI 29.95 kg/m   Specialty Comments:  No specialty comments available.  PMFS History: Patient Active Problem List   Diagnosis Date Noted  . Severe protein-calorie malnutrition (HCC)   . Acute osteomyelitis of right calcaneus (HCC)   . Sepsis (HCC) 11/13/2019  . Postoperative wound infection  11/13/2019  . Transient hypotension 11/13/2019  . COPD without exacerbation (HCC) 11/13/2019  . Normocytic anemia 11/13/2019  . Hypoalbuminemia 11/13/2019  . Displaced avulsion fracture of tuberosity of right calcaneus, initial encounter for open fracture 10/12/2019  . Fracture of calcaneus, right, open 10/12/2019   Past Medical History:  Diagnosis Date  . COPD (chronic obstructive pulmonary disease) (HCC)   . DJD (degenerative joint disease)   . Hyperlipidemia   . Hypertension     Family History  Problem Relation Age of Onset  . Brain cancer Father   . Lung cancer Paternal Uncle   . Lung cancer Paternal Grandfather     Past Surgical History:  Procedure Laterality Date  . AMPUTATION Right 11/19/2019   Procedure: RIGHT BELOW KNEE AMPUTATION;  Surgeon: 11/21/2019, MD;  Location: [facility] OR;  Service: Orthopedics;  Laterality: Right;  . CHOLECYSTECTOMY  1990  . HEMORROIDECTOMY  2011  . I & D EXTREMITY Right 10/11/2019   Procedure: IRRIGATION AND DEBRIDEMENT RIGHT ANKLE;  Surgeon: 10/13/2019, MD;  Location: [facility] OR;  Service: Orthopedics;  Laterality: Right;  . I & D EXTREMITY Right 11/13/2019   Procedure: IRRIGATION AND DEBRIDEMENT EXTREMITY;  Surgeon: 11/15/2019, MD;  Location: [facility] OR;  Service: Orthopedics;  Laterality: Right;  . lower back surgery   1985   pt had 5 back surgeries total in same year  . ORIF CALCANEOUS FRACTURE Right 10/11/2019   Procedure: Open Reduction Internal Fixation (Orif) Calcaneous Fracture and  Ankle Medial Malleolus ;  Surgeon: 10/13/2019, MD;  Location: [facility] OR;  Service: Orthopedics;  Laterality: Right;  . REPLACEMENT TOTAL KNEE Left 2013  . TONSILLECTOMY  1964   Social History   Occupational History  . Not on file  Tobacco Use  . Smoking status: Current Every Day Smoker  . Smokeless tobacco: Never Used  Vaping Use  . Vaping Use: Unknown  Substance and Sexual Activity  . Alcohol use: Yes  . Drug use: Not Currently   . Sexual activity: Not on file

## 2020-07-28 ENCOUNTER — Telehealth: Payer: Self-pay

## 2020-07-28 NOTE — Telephone Encounter (Signed)
Last OV note states pt is a K2 Ambulator and needs th status to be a K3 for his socket please if you can make amendment to last OV note and let me know then I can notify Thayer Ohm with [employer] and he can print note.

## 2020-07-28 NOTE — Telephone Encounter (Signed)
done

## 2021-05-17 IMAGING — RF DG C-ARM 1-60 MIN
1 series · 4 of 4 positions shown · non-contrast
Comparison: 10/11/2019

CLINICAL DATA: Ankle fracture

EXAM:
RIGHT ANKLE - 2 VIEW; DG C-ARM 1-60 MIN

[Series 1: run · 4 of 4 slices shown]
[im 1/4]
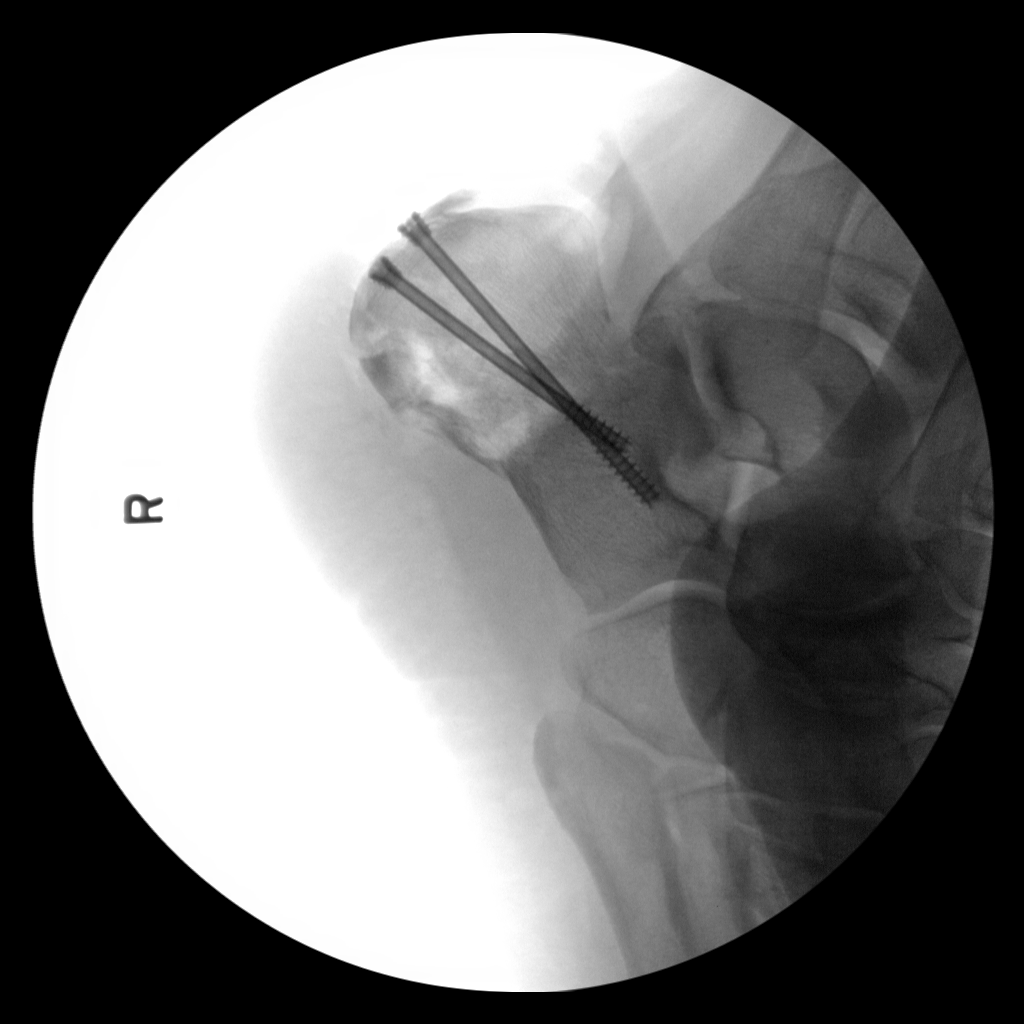
[im 2/4]
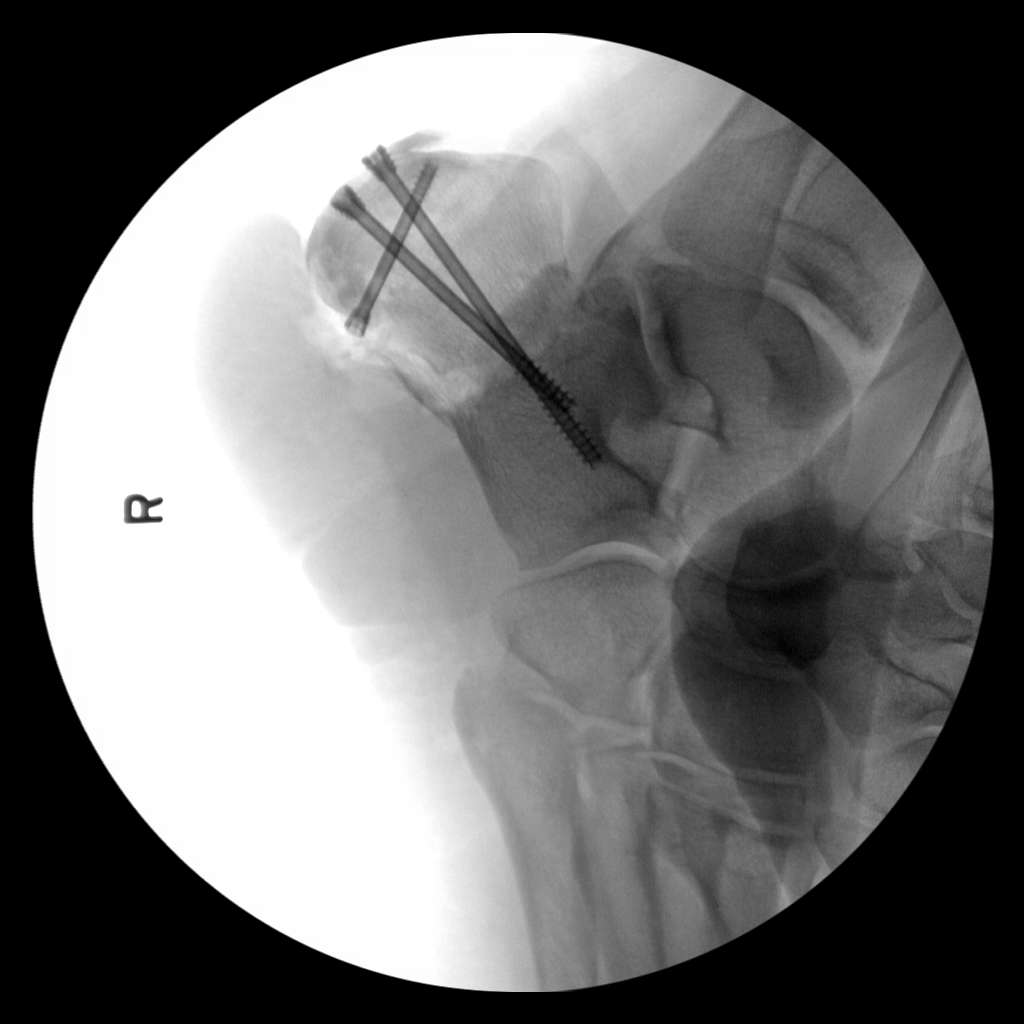
[im 3/4]
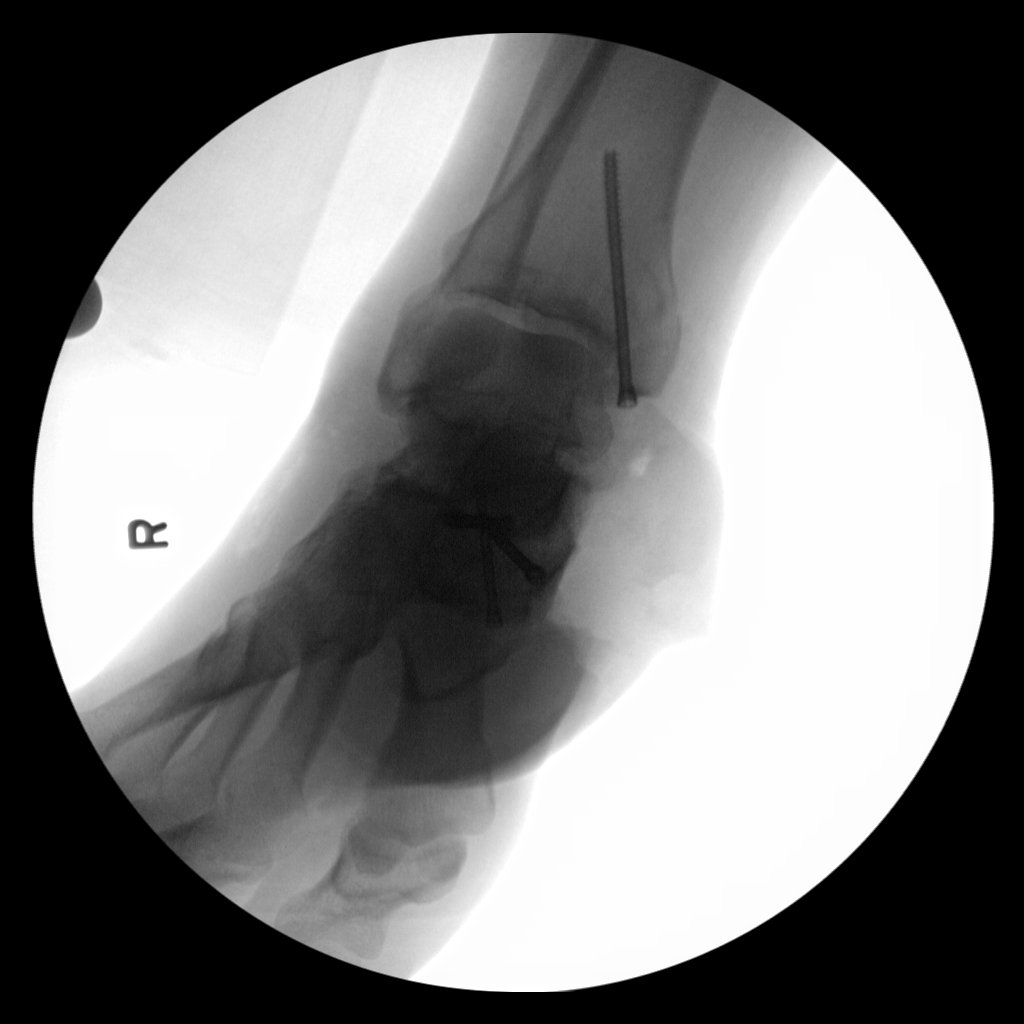
[im 4/4]
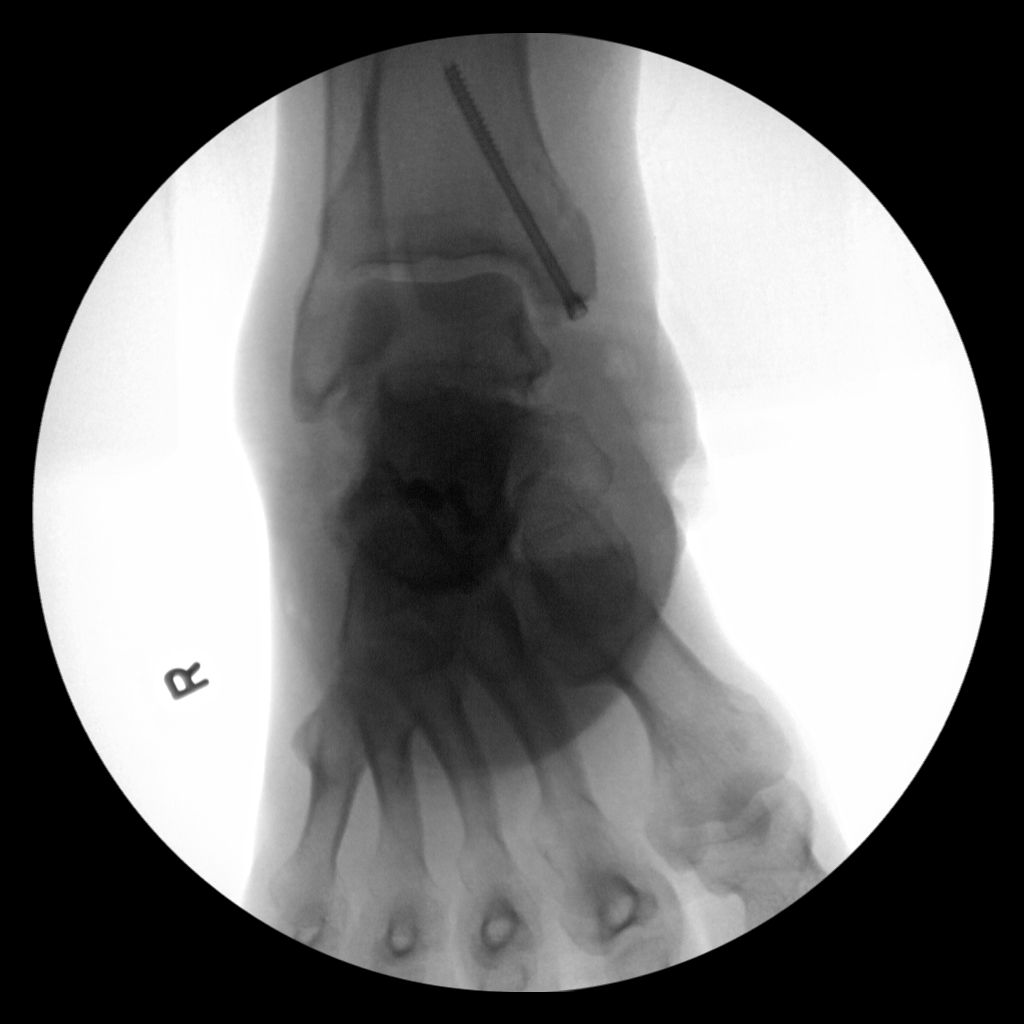

[4 of 4 positions shown; findings below may reference images not displayed]

FINDINGS: Four low resolution intraoperative spot views of the right ankle.
The images demonstrate screw fixation of medial malleolar fracture.
Multiple screw fixation of comminuted calcaneal fracture. Gas in the
soft tissues and soft tissue defect along the posterior ankle
consistent with open injury.
IMPRESSION: Intraoperative fluoroscopic assistance provided during surgical
fixation of medial malleolar and calcaneus fractures
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
## 2021-06-19 IMAGING — US US RENAL
1 series · 14 of 25 positions shown · non-contrast
Comparison: None.

CLINICAL DATA: 63-year-old with acute renal failure. Current
history of hypertension.

EXAM:
RENAL / URINARY TRACT ULTRASOUND COMPLETE

[Series 1: us renal · 14 of 33 slices shown]
[im 1/33]
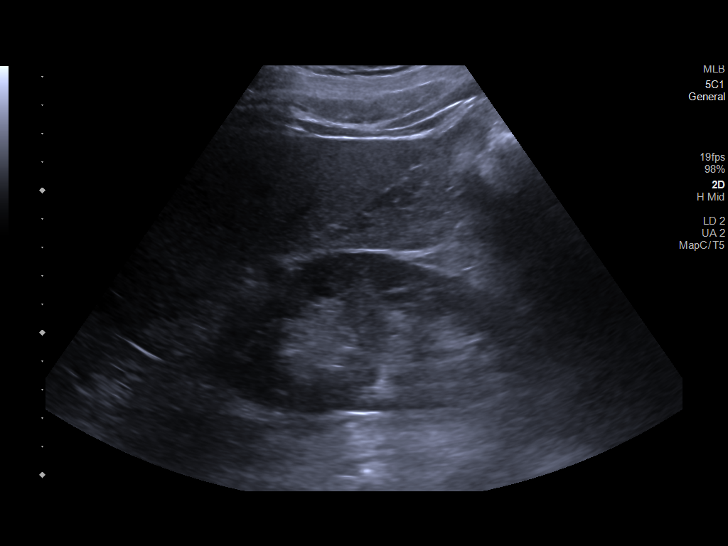
[im 3/33]
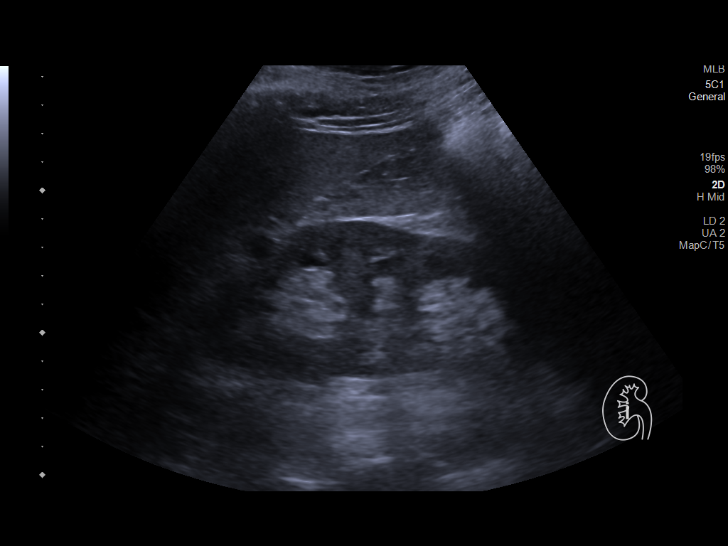
[im 6/33]
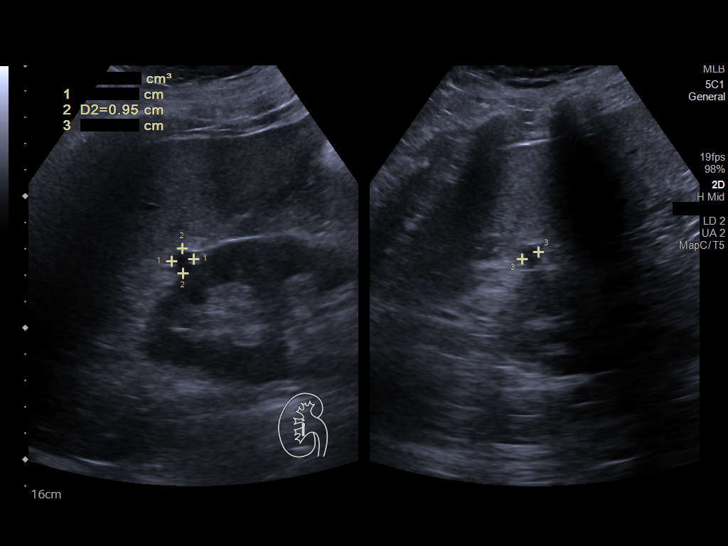
[im 9/33]
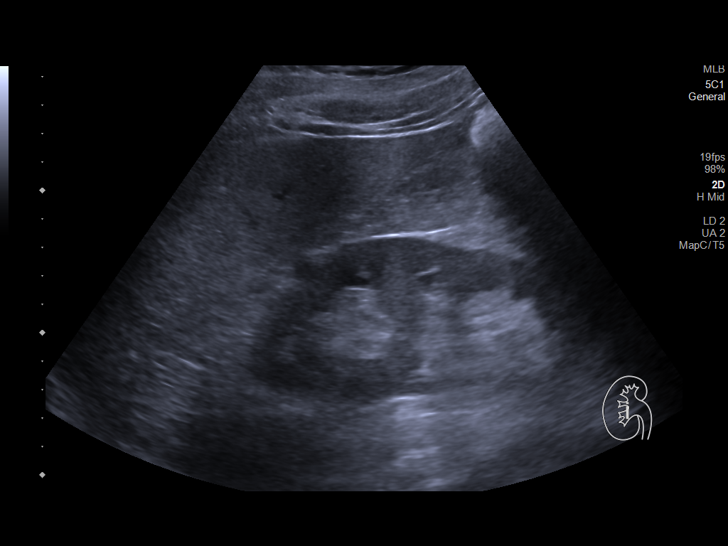
[im 11/33]
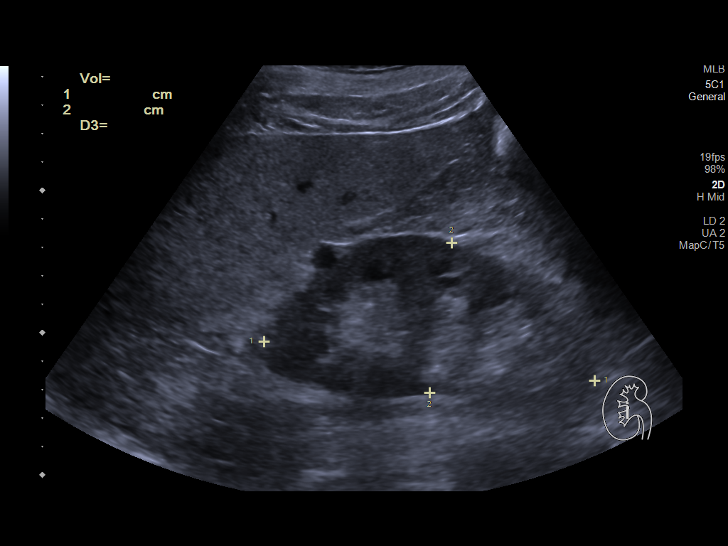
[im 13/33]
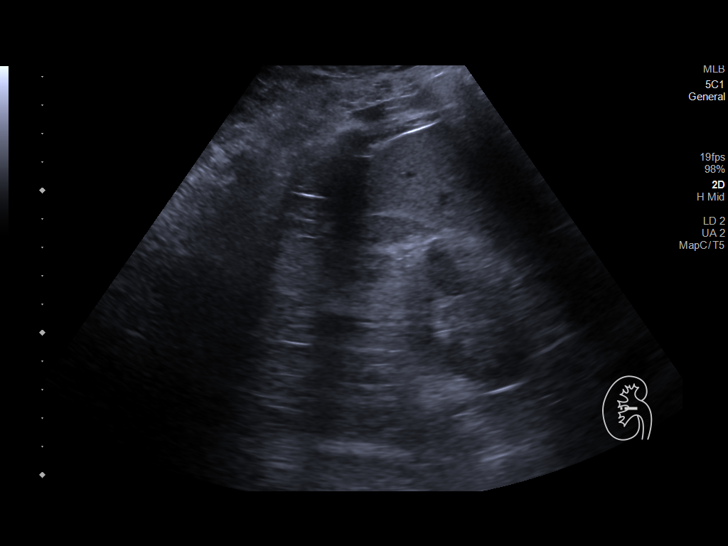
[im 15/33]
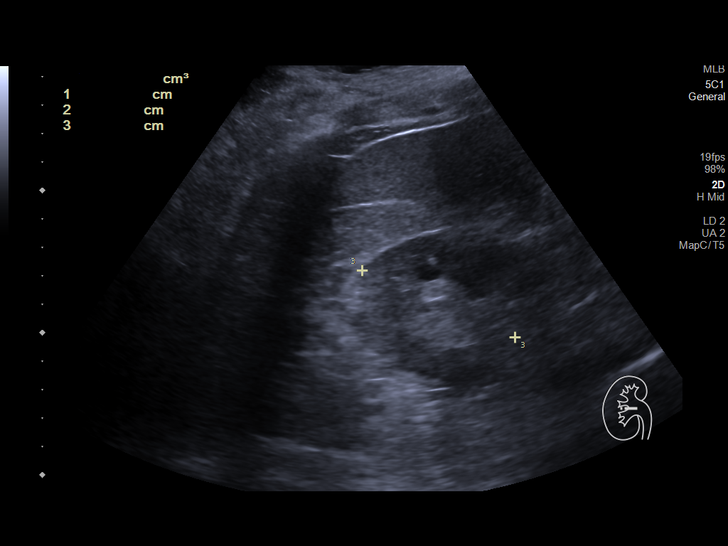
[im 18/33]
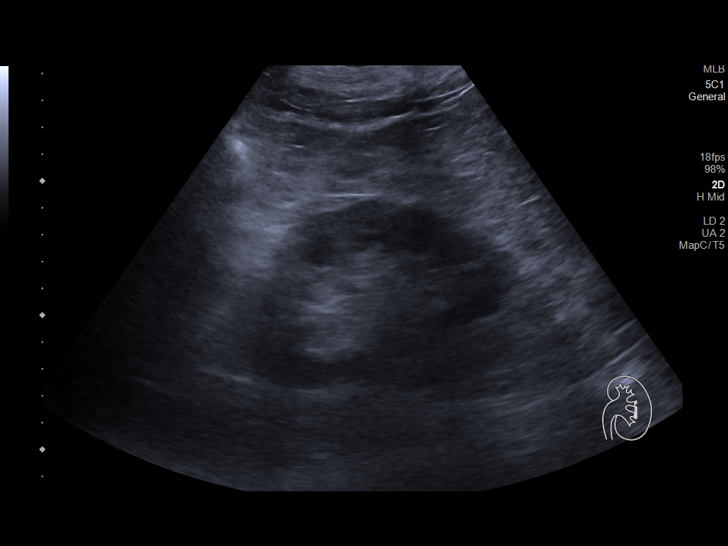
[im 21/33]
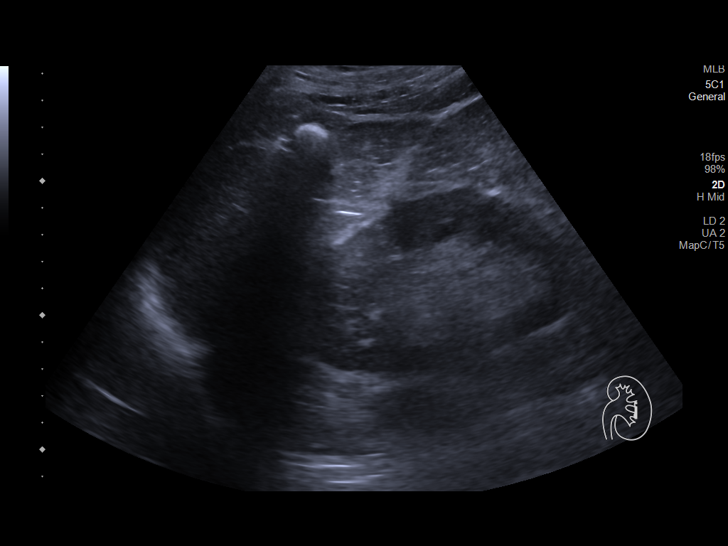
[im 22/33]
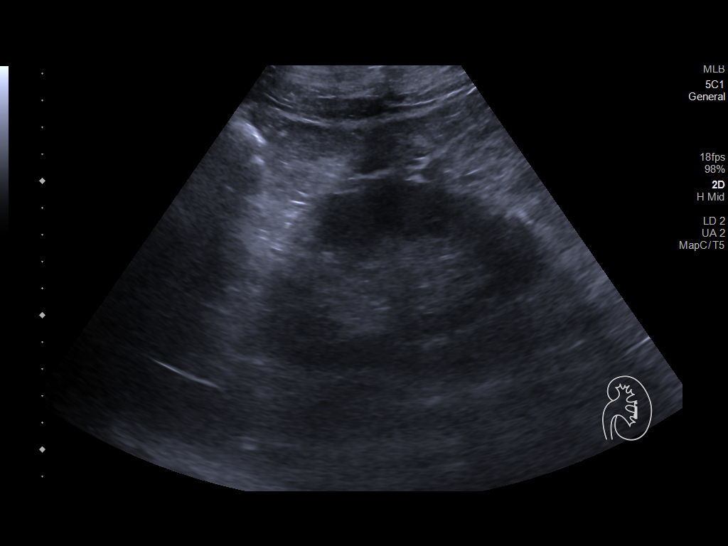
[im 25/33]
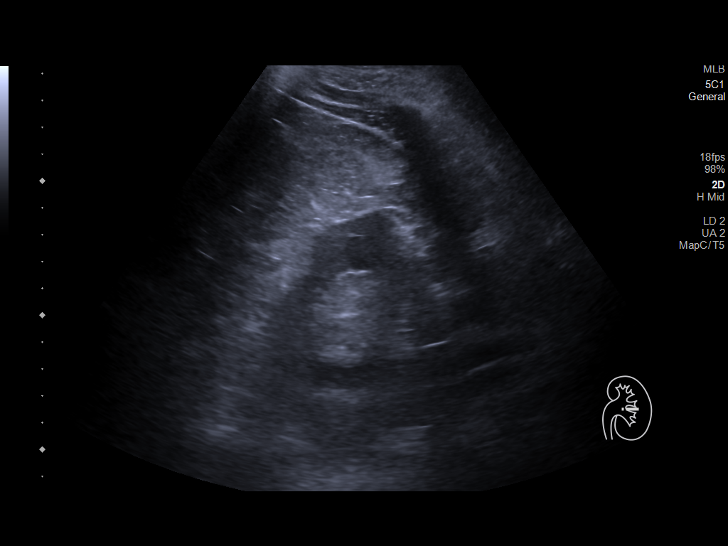
[im 27/33]
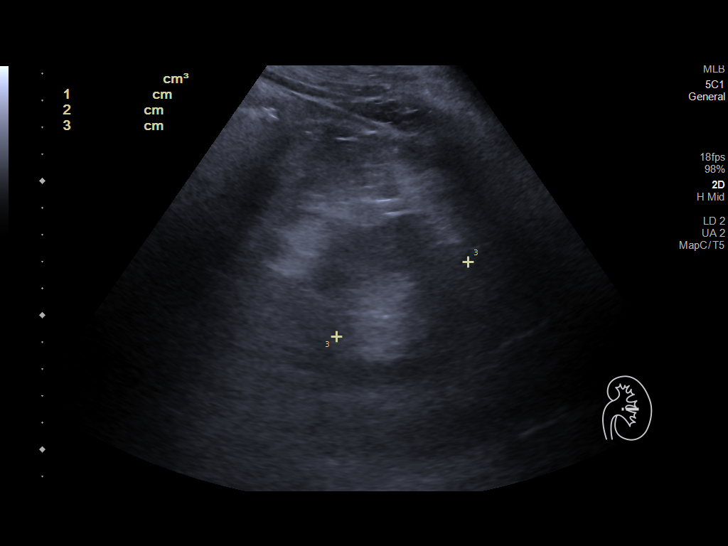
[im 30/33]
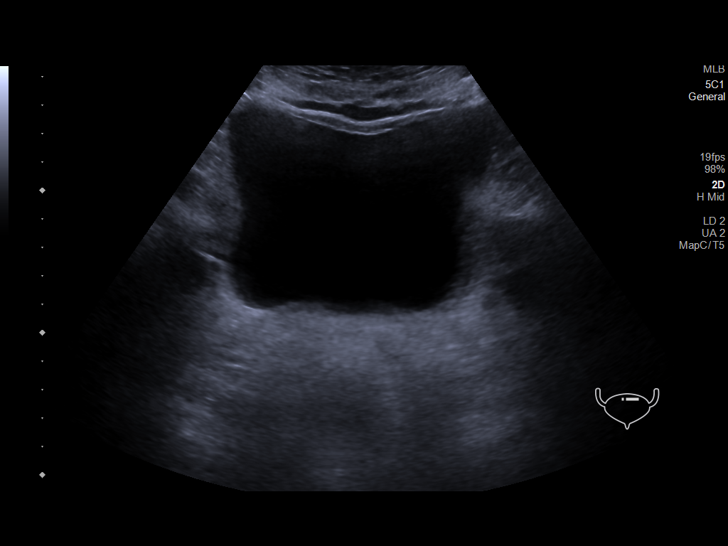
[im 33/33]
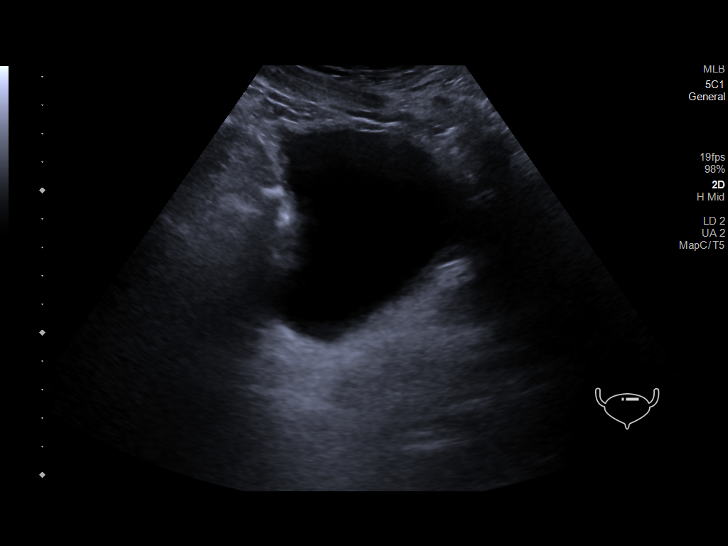

[14 of 25 positions shown; findings below may reference images not displayed]

FINDINGS: Right Kidney:

Renal measurements: Approximately 11.7 x 5.3 x 5.9 cm = volume: 191
mL. Normal parenchymal echotexture. No hydronephrosis. No visible
shadowing calculi approximate 0.8 x 1.0 x 0.7 cm cyst arising from
the UPPER pole. No solid renal masses.

Left Kidney:

Renal measurements: Approximately 811.7 x 6.0 x 5.6 cm = volume: 209
mL. No hydronephrosis. Well-preserved cortex. No shadowing calculi.
Normal parenchymal echotexture. No focal parenchymal abnormality.

Bladder:

Normal for degree of bladder distention.

Other:

None.
IMPRESSION: 1. No evidence of hydronephrosis involving either kidney to suggest
obstruction.
2. Benign 1 cm cyst arising from the UPPER pole the RIGHT kidney.
Otherwise normal examination.
